# Patient Record
Sex: Male | Born: 1980 | Race: Black or African American | Hispanic: No | Marital: Single | Smoking: Current some day smoker
Health system: Southern US, Community
[De-identification: ages and names within clinical notes are randomized; demographics above are authoritative.]

## PROBLEM LIST (undated history)

## (undated) DIAGNOSIS — L409 Psoriasis, unspecified: Secondary | ICD-10-CM

## (undated) DIAGNOSIS — K259 Gastric ulcer, unspecified as acute or chronic, without hemorrhage or perforation: Secondary | ICD-10-CM

---
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
## 2003-12-10 ENCOUNTER — Emergency Department (HOSPITAL_COMMUNITY): Admission: EM | Admit: 2003-12-10 | Discharge: 2003-12-10 | Payer: Self-pay | Admitting: Emergency Medicine

## 2003-12-26 ENCOUNTER — Emergency Department (HOSPITAL_COMMUNITY): Admission: EM | Admit: 2003-12-26 | Discharge: 2003-12-26 | Payer: Self-pay | Admitting: *Deleted

## 2004-05-01 ENCOUNTER — Emergency Department (HOSPITAL_COMMUNITY): Admission: EM | Admit: 2004-05-01 | Discharge: 2004-05-01 | Payer: Self-pay | Admitting: Emergency Medicine

## 2004-07-12 ENCOUNTER — Emergency Department (HOSPITAL_COMMUNITY): Admission: EM | Admit: 2004-07-12 | Discharge: 2004-07-12 | Payer: Self-pay | Admitting: Emergency Medicine

## 2004-09-12 ENCOUNTER — Emergency Department (HOSPITAL_COMMUNITY): Admission: EM | Admit: 2004-09-12 | Discharge: 2004-09-12 | Payer: Self-pay | Admitting: Emergency Medicine

## 2005-03-18 ENCOUNTER — Emergency Department (HOSPITAL_COMMUNITY): Admission: EM | Admit: 2005-03-18 | Discharge: 2005-03-18 | Payer: Self-pay | Admitting: Family Medicine

## 2005-06-06 ENCOUNTER — Emergency Department (HOSPITAL_COMMUNITY): Admission: EM | Admit: 2005-06-06 | Discharge: 2005-06-06 | Payer: Self-pay | Admitting: Family Medicine

## 2005-09-29 IMAGING — CR DG CHEST 2V
2 series · 2 of 2 positions shown · non-contrast
Comparison: none

CLINICAL DATA: 23-year-old male, chest pain.
 CHEST, TWO VIEWS 12/26/03

[view not recorded (1 of 2)]
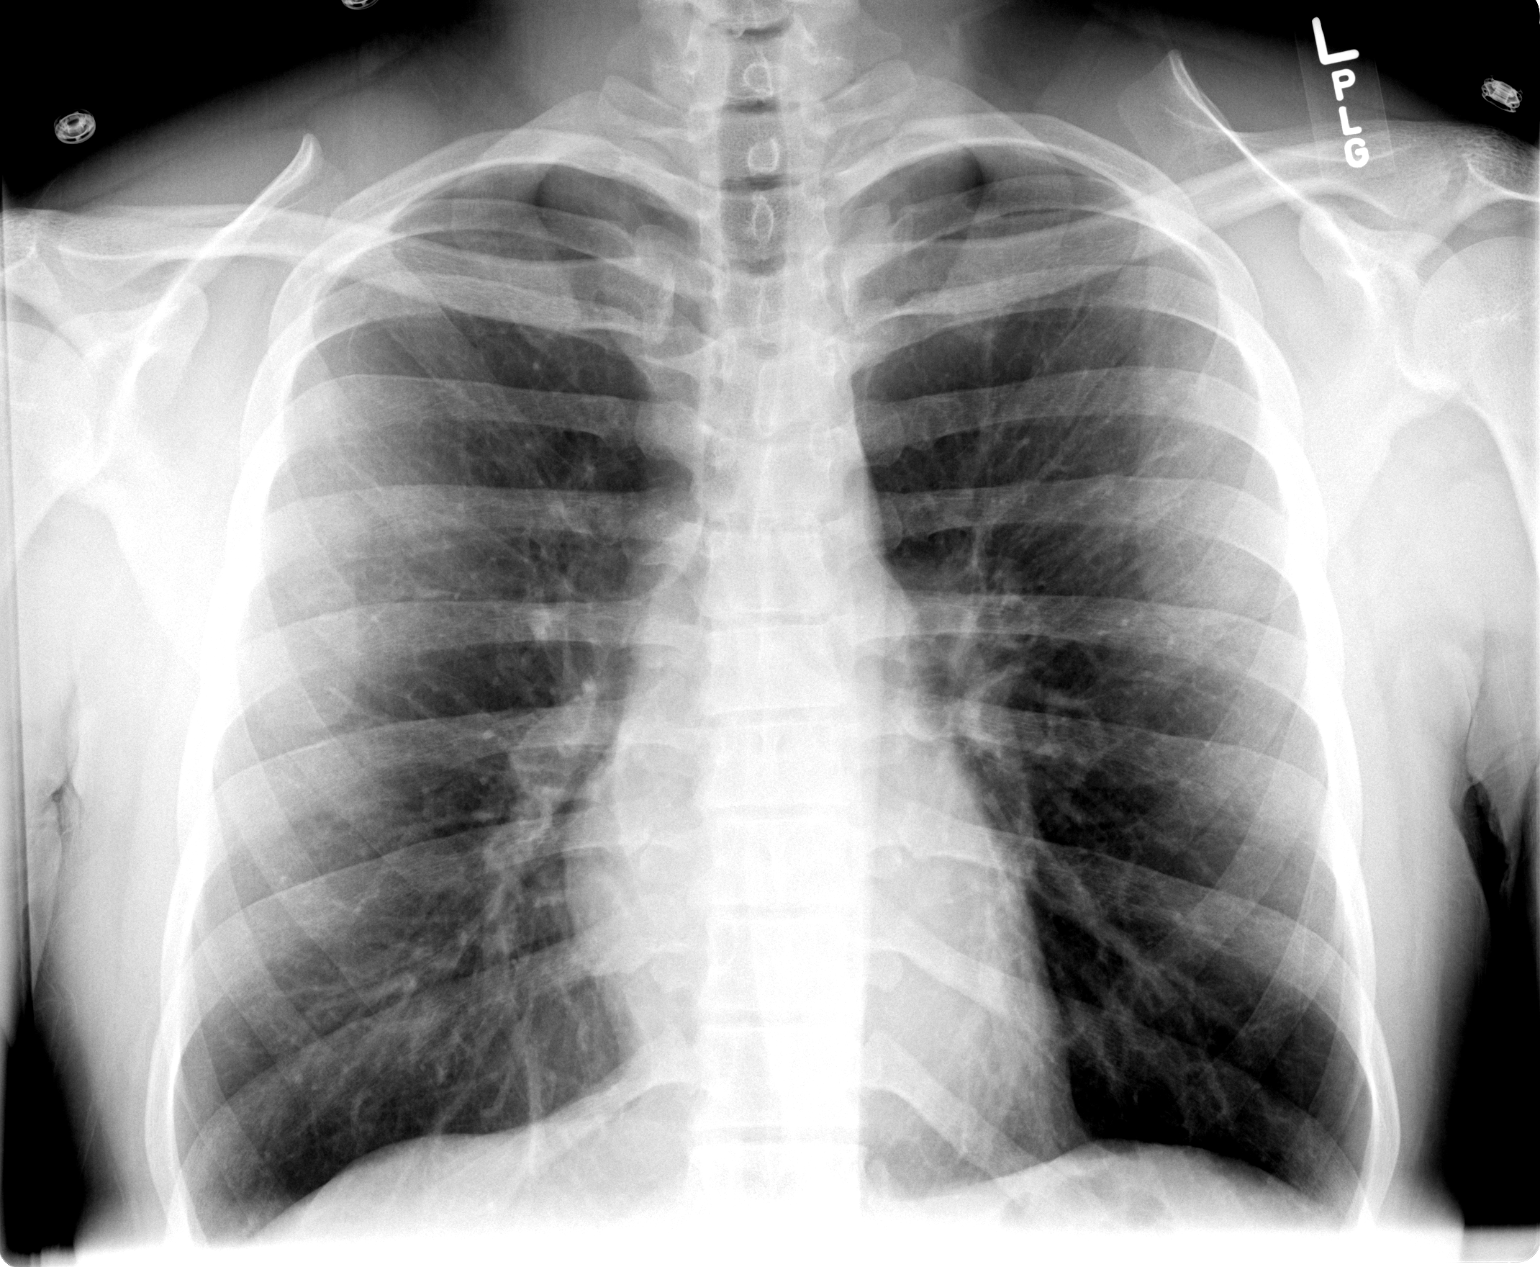

[view not recorded (2 of 2)]
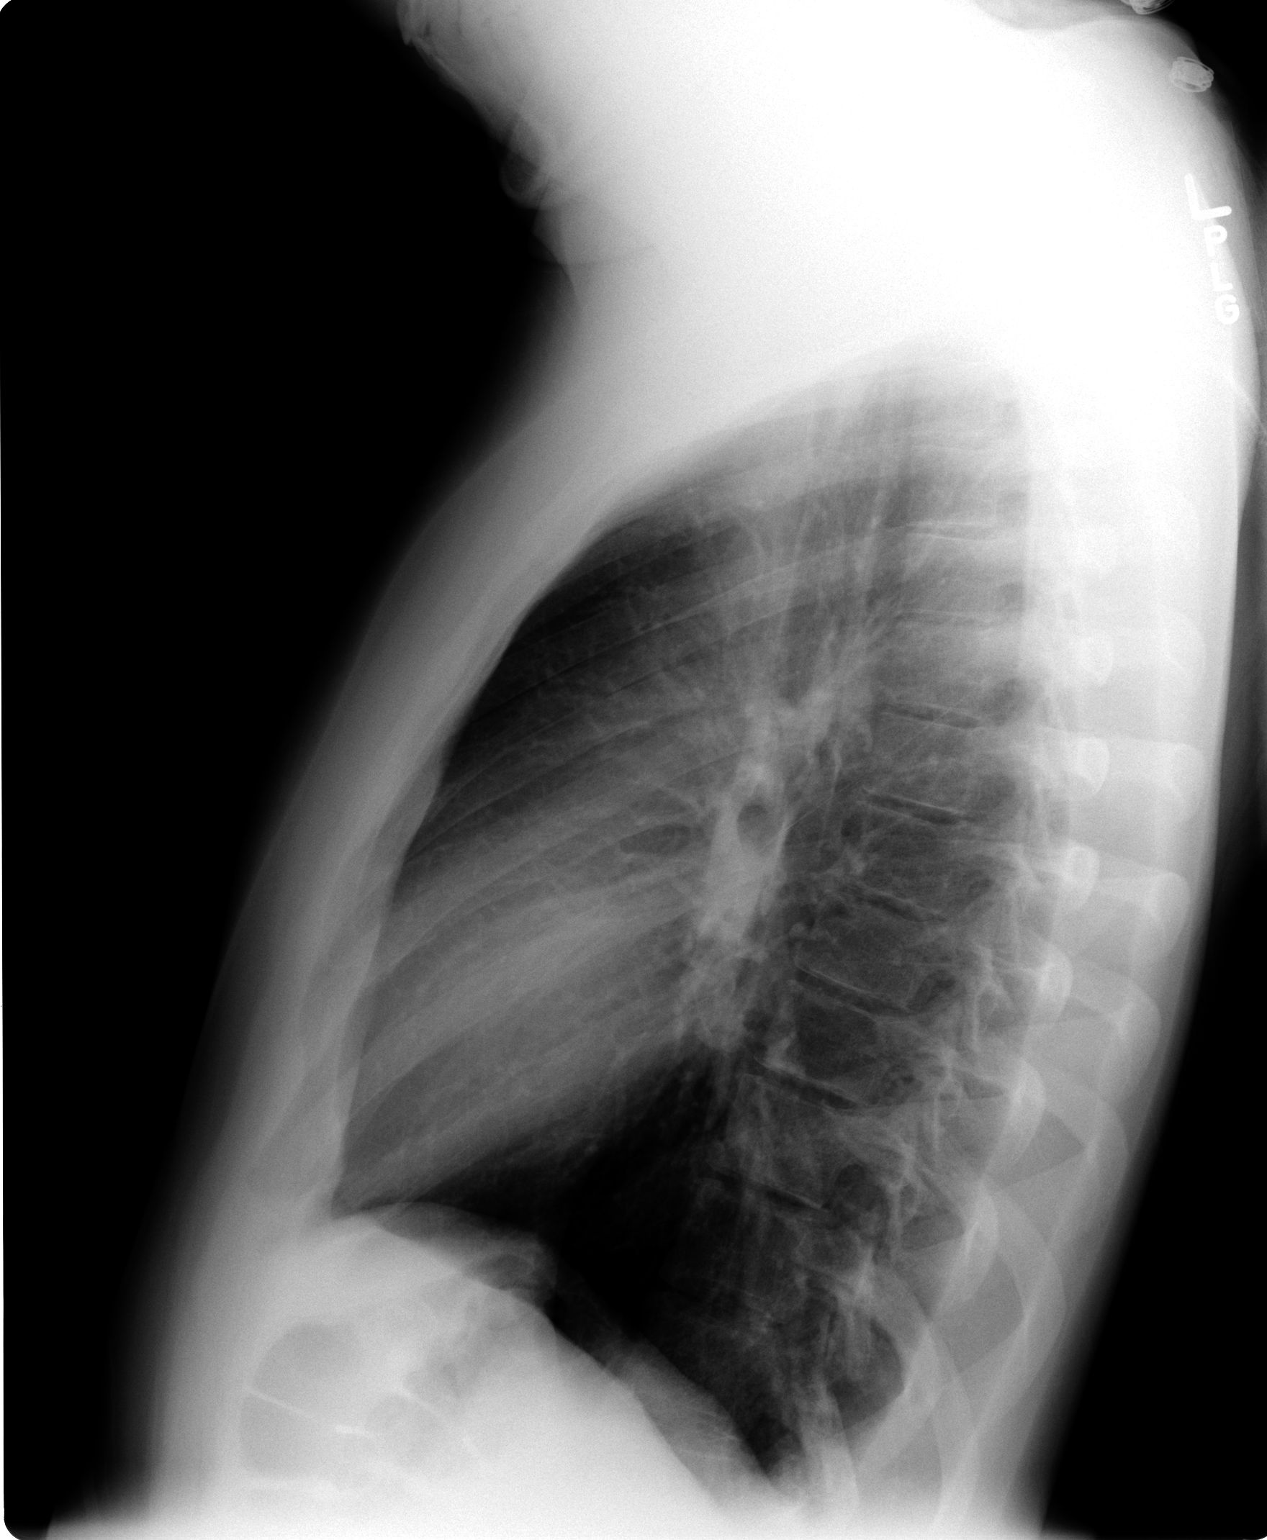

[2 of 2 positions shown; findings below may reference images not displayed]

FINDINGS: Inferolateral costophrenic angles are excluded on this study.  The lungs are clear.  No active airspace disease, edema, effusion, or pneumothorax.  Heart size is normal.  
 IMPRESSION
 No active chest disease.

## 2008-11-25 ENCOUNTER — Emergency Department: Payer: Self-pay | Admitting: Emergency Medicine

## 2010-09-11 ENCOUNTER — Emergency Department: Payer: Self-pay | Admitting: Emergency Medicine

## 2010-10-09 ENCOUNTER — Emergency Department: Payer: Self-pay | Admitting: Emergency Medicine

## 2011-02-13 ENCOUNTER — Emergency Department: Payer: Self-pay | Admitting: Emergency Medicine

## 2011-02-23 ENCOUNTER — Emergency Department: Payer: Self-pay | Admitting: Emergency Medicine

## 2011-06-27 ENCOUNTER — Emergency Department: Payer: Self-pay | Admitting: Emergency Medicine

## 2014-06-06 ENCOUNTER — Emergency Department: Payer: Self-pay | Admitting: Emergency Medicine

## 2014-06-06 LAB — BASIC METABOLIC PANEL
Anion Gap: 5 — ABNORMAL LOW (ref 7–16)
BUN: 10 mg/dL (ref 7–18)
CO2: 30 mmol/L (ref 21–32)
CREATININE: 1.15 mg/dL (ref 0.60–1.30)
Calcium, Total: 8.6 mg/dL (ref 8.5–10.1)
Chloride: 106 mmol/L (ref 98–107)
EGFR (African American): >60
Glucose: 88 mg/dL (ref 65–99)
Osmolality: 280 (ref 275–301)
Potassium: 3.8 mmol/L (ref 3.5–5.1)
SODIUM: 141 mmol/L (ref 136–145)

## 2014-06-06 LAB — CBC
HCT: 50.8 % (ref 40.0–52.0)
HGB: 16.7 g/dL (ref 13.0–18.0)
MCH: 30.5 pg (ref 26.0–34.0)
MCHC: 33 g/dL (ref 32.0–36.0)
MCV: 92 fL (ref 80–100)
Platelet: 217 10*3/uL (ref 150–440)
RBC: 5.5 10*6/uL (ref 4.40–5.90)
RDW: 13.5 % (ref 11.5–14.5)
WBC: 7.4 10*3/uL (ref 3.8–10.6)

## 2014-06-06 LAB — TROPONIN I

## 2014-08-06 ENCOUNTER — Emergency Department: Payer: Self-pay | Admitting: Student

## 2015-04-10 ENCOUNTER — Emergency Department
Admission: EM | Admit: 2015-04-10 | Discharge: 2015-04-10 | Disposition: A | Discharge status: Home / Self Care | Payer: Self-pay | Attending: Emergency Medicine | Admitting: Emergency Medicine

## 2015-04-10 ENCOUNTER — Encounter: Payer: Self-pay | Admitting: Emergency Medicine

## 2015-04-10 DIAGNOSIS — Z720 Tobacco use: Secondary | ICD-10-CM | POA: Insufficient documentation

## 2015-04-10 DIAGNOSIS — J069 Acute upper respiratory infection, unspecified: Secondary | ICD-10-CM | POA: Insufficient documentation

## 2015-04-10 DIAGNOSIS — F17200 Nicotine dependence, unspecified, uncomplicated: Secondary | ICD-10-CM

## 2015-04-10 NOTE — ED Notes (Signed)
Sent from work with cold like symptoms , cough productive ( greenish) , congestion , sore throat, felt warm

## 2015-04-10 NOTE — Discharge Instructions (Signed)
Upper Respiratory Infection, Adult An upper respiratory infection (URI) is also sometimes known as the common cold. The upper respiratory tract includes the nose, sinuses, throat, trachea, and bronchi. Bronchi are the airways leading to the lungs. Most people improve within 1 week, but symptoms can last up to 2 weeks. A residual cough may last even longer.  CAUSES Many different viruses can infect the tissues lining the upper respiratory tract. The tissues become irritated and inflamed and often become very moist. Mucus production is also common. A cold is contagious. You can easily spread the virus to others by oral contact. This includes kissing, sharing a glass, coughing, or sneezing. Touching your mouth or nose and then touching a surface, which is then touched by another person, can also spread the virus. SYMPTOMS  Symptoms typically develop 1 to 3 days after you come in contact with a cold virus. Symptoms vary from person to person. They may include:  Runny nose.  Sneezing.  Nasal congestion.  Sinus irritation.  Sore throat.  Loss of voice (laryngitis).  Cough.  Fatigue.  Muscle aches.  Loss of appetite.  Headache.  Low-grade fever. DIAGNOSIS  You might diagnose your own cold based on familiar symptoms, since most people get a cold 2 to 3 times a year. Your caregiver can confirm this based on your exam. Most importantly, your caregiver can check that your symptoms are not due to another disease such as strep throat, sinusitis, pneumonia, asthma, or epiglottitis. Blood tests, throat tests, and X-rays are not necessary to diagnose a common cold, but they may sometimes be helpful in excluding other more serious diseases. Your caregiver will decide if any further tests are required. RISKS AND COMPLICATIONS  You may be at risk for a more severe case of the common cold if you smoke cigarettes, have chronic heart disease (such as heart failure) or lung disease (such as asthma), or if  you have a weakened immune system. The very young and very old are also at risk for more serious infections. Bacterial sinusitis, middle ear infections, and bacterial pneumonia can complicate the common cold. The common cold can worsen asthma and chronic obstructive pulmonary disease (COPD). Sometimes, these complications can require emergency medical care and may be life-threatening. PREVENTION  The best way to protect against getting a cold is to practice good hygiene. Avoid oral or hand contact with people with cold symptoms. Wash your hands often if contact occurs. There is no clear evidence that vitamin C, vitamin E, echinacea, or exercise reduces the chance of developing a cold. However, it is always recommended to get plenty of rest and practice good nutrition. TREATMENT  Treatment is directed at relieving symptoms. There is no cure. Antibiotics are not effective, because the infection is caused by a virus, not by bacteria. Treatment may include:  Increased fluid intake. Sports drinks offer valuable electrolytes, sugars, and fluids.  Breathing heated mist or steam (vaporizer or shower).  Eating chicken soup or other clear broths, and maintaining good nutrition.  Getting plenty of rest.  Using gargles or lozenges for comfort.  Controlling fevers with ibuprofen or acetaminophen as directed by your caregiver.  Increasing usage of your inhaler if you have asthma. Zinc gel and zinc lozenges, taken in the first 24 hours of the common cold, can shorten the duration and lessen the severity of symptoms. Pain medicines may help with fever, muscle aches, and throat pain. A variety of non-prescription medicines are available to treat congestion and runny nose. Your caregiver   can make recommendations and may suggest nasal or lung inhalers for other symptoms.  HOME CARE INSTRUCTIONS   Only take over-the-counter or prescription medicines for pain, discomfort, or fever as directed by your  caregiver.  Use a warm mist humidifier or inhale steam from a shower to increase air moisture. This may keep secretions moist and make it easier to breathe.  Drink enough water and fluids to keep your urine clear or pale yellow.  Rest as needed.  Return to work when your temperature has returned to normal or as your caregiver advises. You may need to stay home longer to avoid infecting others. You can also use a face mask and careful hand washing to prevent spread of the virus. SEEK MEDICAL CARE IF:   After the first few days, you feel you are getting worse rather than better.  You need your caregiver's advice about medicines to control symptoms.  You develop chills, worsening shortness of breath, or brown or red sputum. These may be signs of pneumonia.  You develop yellow or brown nasal discharge or pain in the face, especially when you bend forward. These may be signs of sinusitis.  You develop a fever, swollen neck glands, pain with swallowing, or white areas in the back of your throat. These may be signs of strep throat. SEEK IMMEDIATE MEDICAL CARE IF:   You have a fever.  You develop severe or persistent headache, ear pain, sinus pain, or chest pain.  You develop wheezing, a prolonged cough, cough up blood, or have a change in your usual mucus (if you have chronic lung disease).  You develop sore muscles or a stiff neck. Document Released: 01/17/2001 Document Revised: 10/16/2011 Document Reviewed: 10/29/2013 ExitCare Patient Information 2015 [employer]. This information is not intended to replace advice given to you by your health care provider. Make sure you discuss any questions you have with your health care provider.  

## 2015-04-10 NOTE — ED Provider Notes (Signed)
[facility] Emergency Department Provider Note ____________________________________________  Time seen: Approximately 5:13 PM  I have reviewed the triage vital signs and the nursing notes.   HISTORY  Chief Complaint URI   HPI Stanley Cobb is a 34 y.o. male is here with complaint of productive cough, congestion, sore throat for a couple of days. He states his work made him come.  He has been taking ibuprofen for his symptoms without any relief. He denies any fever. He states he is a smoker approximately half pack cigarettes per day. He denies any previous bronchitis or pneumonia. He did have asthma as a child. Currently complains of his pain being as 7 out of 10 when he is coughing.   History reviewed. No pertinent past medical history.  There are no active problems to display for this patient.   History reviewed. No pertinent past surgical history.  No current outpatient prescriptions on file.  Allergies Review of patient's allergies indicates no known allergies.  No family history on file.  Social History Social History  Substance Use Topics  . Smoking status: Current Some Day Smoker  . Smokeless tobacco: None  . Alcohol Use: None    Review of Systems Constitutional: No fever/chills Eyes: No visual changes. ENT: Positive sore throat. Cardiovascular: Denies chest pain. Respiratory: Denies shortness of breath. Productive cough Gastrointestinal: No abdominal pain.  No nausea, no vomiting.  No diarrhea.  No constipation. Genitourinary: Negative for dysuria. Musculoskeletal: Negative for back pain. Skin: Negative for rash. Neurological: Negative for headaches, focal weakness or numbness.  10-point ROS otherwise negative.  ____________________________________________   PHYSICAL EXAM:  VITAL SIGNS: ED Triage Vitals  Enc Vitals Group     BP 04/10/15 1700 130/94 mmHg     Pulse Rate 04/10/15 1700 90     Resp 04/10/15 1700 20     Temp  04/10/15 1700 97.8 F (36.6 C)     Temp Source 04/10/15 1700 Oral     SpO2 04/10/15 1700 97 %     Weight 04/10/15 1700 185 lb (83.915 kg)     Height 04/10/15 1700 6\' 2"  (1.88 m)     Head Cir --      Peak Flow --      Pain Score 04/10/15 1700 7     Pain Loc --      Pain Edu? --      Excl. in GC? --     Constitutional: Alert and oriented. Well appearing and in no acute distress. Eyes: Conjunctivae are normal. PERRL. EOMI. Head: Atraumatic. Nose: Moderate nasal congestion/no rhinnorhea. Mouth/Throat: Mucous membranes are moist.  Oropharynx non-erythematous. Neck: No stridor.  Supple Hematological/Lymphatic/Immunilogical: No cervical lymphadenopathy. Cardiovascular: Normal rate, regular rhythm. Grossly normal heart sounds.  Good peripheral circulation. Respiratory: Normal respiratory effort.  No retractions. Lungs CTAB. Gastrointestinal: Soft and nontender. No distention. Musculoskeletal: No lower extremity tenderness nor edema.  No joint effusions. Neurologic:  Normal speech and language. No gross focal neurologic deficits are appreciated. No gait instability. Skin:  Skin is warm, dry and intact. No rash noted. Psychiatric: Mood and affect are normal. Speech and behavior are normal.  ____________________________________________   LABS (all labs ordered are listed, but only abnormal results are displayed)  Labs Reviewed - No data to display  PROCEDURES  Procedure(s) performed: None  Critical Care performed: No  ____________________________________________   INITIAL IMPRESSION / ASSESSMENT AND PLAN / ED COURSE  Pertinent labs & imaging results that were available during my care of the patient  were reviewed by me and considered in my medical decision making (see chart for details).  Patient has not taken any  Medication for congestion. He was offered a prescription but states that he will get something over-the-counter. He basically wants a note saying that he was  here. ____________________________________________   FINAL CLINICAL IMPRESSION(S) / ED DIAGNOSES  Final diagnoses:  Acute upper respiratory infection  Smoker      Tommi Rumps, PA-C 04/10/15 1934  Arnaldo Natal, MD 04/10/15 2127

## 2015-07-20 ENCOUNTER — Encounter: Payer: Self-pay | Admitting: Emergency Medicine

## 2015-07-20 ENCOUNTER — Emergency Department: Payer: Self-pay

## 2015-07-20 ENCOUNTER — Emergency Department
Admission: EM | Admit: 2015-07-20 | Discharge: 2015-07-20 | Disposition: A | Discharge status: Home / Self Care | Payer: Self-pay | Attending: Emergency Medicine | Admitting: Emergency Medicine

## 2015-07-20 DIAGNOSIS — F17210 Nicotine dependence, cigarettes, uncomplicated: Secondary | ICD-10-CM | POA: Insufficient documentation

## 2015-07-20 DIAGNOSIS — R059 Cough, unspecified: Secondary | ICD-10-CM

## 2015-07-20 DIAGNOSIS — B9789 Other viral agents as the cause of diseases classified elsewhere: Secondary | ICD-10-CM

## 2015-07-20 DIAGNOSIS — R05 Cough: Secondary | ICD-10-CM

## 2015-07-20 DIAGNOSIS — J069 Acute upper respiratory infection, unspecified: Secondary | ICD-10-CM | POA: Insufficient documentation

## 2015-07-20 MED ORDER — DM-GUAIFENESIN ER 30-600 MG PO TB12: 1.0000 | ORAL_TABLET | Freq: Two times a day (BID) | ORAL | Status: DC

## 2015-07-20 MED ORDER — BENZONATATE 100 MG PO CAPS: 100.0000 mg | ORAL_CAPSULE | Freq: Three times a day (TID) | ORAL | Status: DC | PRN

## 2015-07-20 NOTE — ED Provider Notes (Signed)
CSN: [id number]     Arrival date & time 07/20/15  1011 History   First MD Initiated Contact with Patient 07/20/15 1032     Chief Complaint  Patient presents with  . Influenza    HPI Comments: 34 year old male presents today complaining of cough, congestion and problems breathing for the last week. He admits to smoking a pack per day of cigarettes. He has not measured his temperature but thinks he's had a fever. His daughter has been sick with similar symptoms. Does not have a history of asthma, pneumonia or lung disease. Has been taking TheraFlu over-the-counter without relief.  The history is provided by the patient.    History reviewed. No pertinent past medical history. History reviewed. No pertinent past surgical history. No family history on file. Social History  Substance Use Topics  . Smoking status: Current Some Day Smoker  . Smokeless tobacco: None  . Alcohol Use: No    Review of Systems  Constitutional: Positive for fever. Negative for chills.  HENT: Positive for congestion.   Respiratory: Positive for cough. Negative for shortness of breath.   Musculoskeletal: Negative for myalgias and arthralgias.  Skin: Negative for rash.  All other systems reviewed and are negative.     Allergies  Review of patient's allergies indicates no known allergies.  Home Medications   Prior to Admission medications   Medication Sig Start Date End Date Taking? Authorizing Provider  benzonatate (TESSALON PERLES) 100 MG capsule Take 1 capsule (100 mg total) by mouth 3 (three) times daily as needed for cough. 07/20/15 07/19/16  Luvenia Redden, PA-C  dextromethorphan-guaiFENesin (MUCINEX DM) 30-600 MG 12hr tablet Take 1 tablet by mouth 2 (two) times daily. 07/20/15   Wilber Oliphant V, PA-C   BP 120/80 mmHg  Pulse 76  Temp(Src) 97.7 F (36.5 C) (Oral)  Resp 16  Ht  (1.88 m)  Wt 83.915 kg  BMI 23.74 kg/m2  SpO2 99% Physical Exam  Constitutional: He is oriented to person, place,  and time. Vital signs are normal. He appears well-developed and well-nourished. He is active.  Non-toxic appearance. He does not have a sickly appearance. He does not appear ill.  HENT:  Head: Normocephalic and atraumatic.  Right Ear: Tympanic membrane and external ear normal.  Left Ear: Tympanic membrane and external ear normal.  Nose: Mucosal edema and rhinorrhea present.  Mouth/Throat: Uvula is midline and oropharynx is clear and moist.  Eyes: Conjunctivae and EOM are normal. Pupils are equal, round, and reactive to light.  Neck: Normal range of motion. Neck supple.  Cardiovascular: Normal rate, regular rhythm, normal heart sounds and intact distal pulses.  Exam reveals no gallop and no friction rub.   No murmur heard. Pulmonary/Chest: Effort normal. He has wheezes. He has no rales.  Musculoskeletal: Normal range of motion.  Lymphadenopathy:    He has no cervical adenopathy.  Neurological: He is alert and oriented to person, place, and time.  Skin: Skin is warm and dry. No rash noted.  Psychiatric: He has a normal mood and affect. His behavior is normal. Judgment and thought content normal.  Nursing note and vitals reviewed.   ED Course  Procedures (including critical care time) Labs Review Labs Reviewed - No data to display  Imaging Review Dg Chest 2 View  07/20/2015  CLINICAL DATA:  Worsening cough, congestion, chest pain and sore throat for 1 week. EXAM: CHEST  2 VIEW COMPARISON:  06/06/2014 FINDINGS: The heart size and mediastinal contours are within normal limits.  Both lungs are clear. The visualized skeletal structures are unremarkable. IMPRESSION: Normal chest x-ray. Electronically Signed   By: Rudie MeyerP.  Gallerani M.D.   On: 07/20/2015 11:31   I have personally reviewed and evaluated these images and lab results as part of my medical decision-making.   EKG Interpretation None      MDM  I independently reviewed the chest x-ray and there is no evidence of cardiopulmonary  disease. Strongly encouraged patient to stop smoking. Prescription for [employer] Perles and Mucinex. Encouraged him to drink plenty of fluids take antipyretics as needed follow-up here if no improvement since [facility] Final diagnoses:  Cough  Viral URI with cough        Luvenia ReddenEmma Weavil V, PA-C 07/20/15 [address]1150  Braelyn Jenson Weavil V, PA-C 07/20/15 1150  Sharman CheekPhillip Stafford, MD 07/20/15 1514

## 2015-07-20 NOTE — ED Notes (Signed)
AAOx3.  Skin warm and dry.  NAD 

## 2015-07-20 NOTE — ED Notes (Signed)
States body aches with cough and congestion  For about 1 week

## 2015-07-20 NOTE — ED Notes (Signed)
See triage note  States he has felt bad for about 1 week..body aches for occasional fever and feels like he can't breath lungs clear   And no resp distress noted at present

## 2015-09-11 ENCOUNTER — Encounter: Payer: Self-pay | Admitting: Emergency Medicine

## 2015-09-11 ENCOUNTER — Emergency Department
Admission: EM | Admit: 2015-09-11 | Discharge: 2015-09-11 | Disposition: A | Discharge status: Home / Self Care | Payer: Self-pay | Attending: Emergency Medicine | Admitting: Emergency Medicine

## 2015-09-11 DIAGNOSIS — J029 Acute pharyngitis, unspecified: Secondary | ICD-10-CM | POA: Insufficient documentation

## 2015-09-11 DIAGNOSIS — Z79899 Other long term (current) drug therapy: Secondary | ICD-10-CM | POA: Insufficient documentation

## 2015-09-11 DIAGNOSIS — F17200 Nicotine dependence, unspecified, uncomplicated: Secondary | ICD-10-CM | POA: Insufficient documentation

## 2015-09-11 LAB — POCT RAPID STREP A: STREPTOCOCCUS, GROUP A SCREEN (DIRECT): NEGATIVE

## 2015-09-11 LAB — MONONUCLEOSIS SCREEN: MONO SCREEN: NEGATIVE

## 2015-09-11 MED ORDER — AMOXICILLIN 500 MG PO TABS: 500.0000 mg | ORAL_TABLET | Freq: Three times a day (TID) | ORAL | Status: DC

## 2015-09-11 MED ORDER — PREDNISONE 20 MG PO TABS
60.0000 mg | ORAL_TABLET | Freq: Once | ORAL | Status: AC
  Administered 2015-09-11: 60 mg via ORAL
  Filled 2015-09-11: qty 3

## 2015-09-11 MED ORDER — PREDNISONE 10 MG (21) PO TBPK
ORAL_TABLET | ORAL | Status: DC
Start: 2015-09-11 — End: 2016-04-13

## 2015-09-11 MED ORDER — IBUPROFEN 800 MG PO TABS
800.0000 mg | ORAL_TABLET | Freq: Once | ORAL | Status: AC
  Administered 2015-09-11: 800 mg via ORAL
  Filled 2015-09-11: qty 1

## 2015-09-11 MED ORDER — AMOXICILLIN 500 MG PO CAPS
500.0000 mg | ORAL_CAPSULE | Freq: Once | ORAL | Status: AC
  Administered 2015-09-11: 500 mg via ORAL
  Filled 2015-09-11: qty 1

## 2015-09-11 NOTE — ED Provider Notes (Signed)
[facility] Emergency Department Provider Note  ____________________________________________  Time seen: Approximately 5:20 PM  I have reviewed the triage vital signs and the nursing notes.   HISTORY  Chief Complaint Sore Throat    HPI Stanley Cobb is a 35 y.o. male who presents with sore throat 2 days. Difficulty swallowing due to pain. Minimal congestion. Feels feverish. Minimal cough. No known exposure. No ear pain chest pain shortness of breath.   History reviewed. No pertinent past medical history.  There are no active problems to display for this patient.   History reviewed. No pertinent past surgical history.  Current Outpatient Rx  Name  Route  Sig  Dispense  Refill  . benzonatate (TESSALON PERLES) 100 MG capsule   Oral   Take 1 capsule (100 mg total) by mouth 3 (three) times daily as needed for cough.   30 capsule   0   . dextromethorphan-guaiFENesin (MUCINEX DM) 30-600 MG 12hr tablet   Oral   Take 1 tablet by mouth 2 (two) times daily.   30 tablet   o     Allergies Review of patient's allergies indicates no known allergies.  No family history on file.  Social History Social History  Substance Use Topics  . Smoking status: Current Some Day Smoker  . Smokeless tobacco: None  . Alcohol Use: No    Review of Systems Constitutional: fever Eyes: No visual changes. ENT: per HPI Cardiovascular: Denies chest pain. Respiratory: Denies shortness of breath.  Gastrointestinal: No abdominal pain.  No nausea, no vomiting.  No diarrhea.  No constipation. Musculoskeletal: Negative for back pain. Skin: Negative for rash. Neurological: Negative for headaches, focal weakness or numbness. 10-point ROS otherwise negative.  ____________________________________________   PHYSICAL EXAM:  VITAL SIGNS: ED Triage Vitals  Enc Vitals Group     BP 09/11/15 1636 127/84 mmHg     Pulse Rate 09/11/15 1636 85     Resp 09/11/15 1636 20   Temp 09/11/15 1636 97.8 F (36.6 C)     Temp Source 09/11/15 1636 Oral     SpO2 09/11/15 1636 98 %     Weight 09/11/15 1636 187 lb (84.823 kg)     Height 09/11/15 1636  (1.88 m)     Head Cir --      Peak Flow --      Pain Score 09/11/15 1636 10     Pain Loc --      Pain Edu? --      Excl. in GC? --     Constitutional: Alert and oriented. Well appearing and in no acute distress. Eyes: Conjunctivae are normal. PERRL. EOMI. Ears:  Clear with normal landmarks. No erythema. Head: Atraumatic. Nose: No congestion/rhinnorhea. Mouth/Throat: Mucous membranes are moist.  Oropharynx erythematous with mild swelling but no clear abscess noted. No lesions. Neck:  Supple.  No adenopathy.   Cardiovascular: Normal rate, regular rhythm. Grossly normal heart sounds.  Good peripheral circulation. Respiratory: Normal respiratory effort.  No retractions. Lungs CTAB. Gastrointestinal: Soft and nontender. No distention. No abdominal bruits. No CVA tenderness. Musculoskeletal: Nml ROM of upper and lower extremity joints. Neurologic:  Normal speech and language. No gross focal neurologic deficits are appreciated. No gait instability. Skin:  Skin is warm, dry and intact. No rash noted. Psychiatric: Mood and affect are normal. Speech and behavior are normal.  ____________________________________________   LABS (all labs ordered are listed, but only abnormal results are displayed)  Labs Reviewed  MONONUCLEOSIS SCREEN  POCT RAPID  STREP A   ____________________________________________  EKG   ____________________________________________  RADIOLOGY   ____________________________________________   PROCEDURES  Procedure(s) performed: None  Critical Care performed: No  ____________________________________________   INITIAL IMPRESSION / ASSESSMENT AND PLAN / ED COURSE  Pertinent labs & imaging results that were available during my care of the patient were reviewed by me and considered in  my medical decision making (see chart for details).  35 year old male with acute onset of severe tonsillitis. Negative strep and mono spot. Because of the severity, there will be a high suspicion for bacterial etiology. Started on amoxicillin. He may take prednisone taper for pain control. Discussed with the patient the possibility of abscess forming. He will return to the emergency room for any worsening symptoms. ____________________________________________   FINAL CLINICAL IMPRESSION(S) / ED DIAGNOSES  Final diagnoses:  Pharyngitis      Ignacia Bayley, PA-C 09/11/15 1814  Ignacia Bayley, PA-C 09/11/15 Rickey Primus  Sharyn Creamer, MD 09/11/15 2250

## 2015-09-11 NOTE — ED Notes (Signed)
Patient presents to the ED for sore throat that began yesterday.  Patient is in no obvious distress at this time.  Patient states, "I feel like I might have a fever."  Patient denies taking temp. At home.  Patient denies nausea, vomiting, diarrhea, and abdominal pain.  Patient is in no obvious distress at this time.

## 2015-09-11 NOTE — Discharge Instructions (Signed)
Pharyngitis Pharyngitis is redness, pain, and swelling (inflammation) of your pharynx.  CAUSES  Pharyngitis is usually caused by infection. Most of the time, these infections are from viruses (viral) and are part of a cold. However, sometimes pharyngitis is caused by bacteria (bacterial). Pharyngitis can also be caused by allergies. Viral pharyngitis may be spread from person to person by coughing, sneezing, and personal items or utensils (cups, forks, spoons, toothbrushes). Bacterial pharyngitis may be spread from person to person by more intimate contact, such as kissing.  SIGNS AND SYMPTOMS  Symptoms of pharyngitis include:   Sore throat.   Tiredness (fatigue).   Low-grade fever.   Headache.  Joint pain and muscle aches.  Skin rashes.  Swollen lymph nodes.  Plaque-like film on throat or tonsils (often seen with bacterial pharyngitis). DIAGNOSIS  Your health care provider will ask you questions about your illness and your symptoms. Your medical history, along with a physical exam, is often all that is needed to diagnose pharyngitis. Sometimes, a rapid strep test is done. Other lab tests may also be done, depending on the suspected cause.  TREATMENT  Viral pharyngitis will usually get better in 3-4 days without the use of medicine. Bacterial pharyngitis is treated with medicines that kill germs (antibiotics).  HOME CARE INSTRUCTIONS   Drink enough water and fluids to keep your urine clear or pale yellow.   Only take over-the-counter or prescription medicines as directed by your health care provider:   If you are prescribed antibiotics, make sure you finish them even if you start to feel better.   Do not take aspirin.   Get lots of rest.   Gargle with 8 oz of salt water ( tsp of salt per 1 qt of water) as often as every 1-2 hours to soothe your throat.   Throat lozenges (if you are not at risk for choking) or sprays may be used to soothe your throat. SEEK MEDICAL  CARE IF:   You have large, tender lumps in your neck.  You have a rash.  You cough up green, yellow-brown, or bloody spit. SEEK IMMEDIATE MEDICAL CARE IF:   Your neck becomes stiff.  You drool or are unable to swallow liquids.  You vomit or are unable to keep medicines or liquids down.  You have severe pain that does not go away with the use of recommended medicines.  You have trouble breathing (not caused by a stuffy nose). MAKE SURE YOU:   Understand these instructions.  Will watch your condition.  Will get help right away if you are not doing well or get worse.   This information is not intended to replace advice given to you by your health care provider. Make sure you discuss any questions you have with your health care provider.   Document Released: 07/24/2005 Document Revised: 05/14/2013 Document Reviewed: 03/31/2013 Elsevier Interactive Patient Education 2016 [employer].   Take antibiotics as directed. You may use prednisone taper for symptoms of pain. He can also use ibuprofen. If any signs of worsening, return to the emergency room for further evaluation.

## 2016-02-27 ENCOUNTER — Emergency Department
Admission: EM | Admit: 2016-02-27 | Discharge: 2016-02-27 | Disposition: A | Discharge status: Home / Self Care | Payer: Self-pay | Attending: Emergency Medicine | Admitting: Emergency Medicine

## 2016-02-27 ENCOUNTER — Emergency Department: Payer: Self-pay

## 2016-02-27 DIAGNOSIS — Y93F2 Activity, caregiving, lifting: Secondary | ICD-10-CM | POA: Insufficient documentation

## 2016-02-27 DIAGNOSIS — L409 Psoriasis, unspecified: Secondary | ICD-10-CM | POA: Insufficient documentation

## 2016-02-27 DIAGNOSIS — X500XXA Overexertion from strenuous movement or load, initial encounter: Secondary | ICD-10-CM | POA: Insufficient documentation

## 2016-02-27 DIAGNOSIS — L989 Disorder of the skin and subcutaneous tissue, unspecified: Secondary | ICD-10-CM

## 2016-02-27 DIAGNOSIS — Y999 Unspecified external cause status: Secondary | ICD-10-CM | POA: Insufficient documentation

## 2016-02-27 DIAGNOSIS — Y929 Unspecified place or not applicable: Secondary | ICD-10-CM | POA: Insufficient documentation

## 2016-02-27 DIAGNOSIS — F17200 Nicotine dependence, unspecified, uncomplicated: Secondary | ICD-10-CM | POA: Insufficient documentation

## 2016-02-27 DIAGNOSIS — S39012A Strain of muscle, fascia and tendon of lower back, initial encounter: Secondary | ICD-10-CM | POA: Insufficient documentation

## 2016-02-27 MED ORDER — IBUPROFEN 800 MG PO TABS: 800.0000 mg | ORAL_TABLET | Freq: Three times a day (TID) | ORAL | 0 refills | Status: DC | PRN

## 2016-02-27 MED ORDER — CYCLOBENZAPRINE HCL 10 MG PO TABS: 10.0000 mg | ORAL_TABLET | Freq: Three times a day (TID) | ORAL | 0 refills | Status: DC | PRN

## 2016-02-27 MED ORDER — CLOTRIMAZOLE-BETAMETHASONE 1-0.05 % EX CREA: TOPICAL_CREAM | CUTANEOUS | 1 refills | Status: DC

## 2016-02-27 MED ORDER — HYDROMORPHONE HCL 1 MG/ML IJ SOLN
1.0000 mg | Freq: Once | INTRAMUSCULAR | Status: AC
  Administered 2016-02-27: 1 mg via INTRAMUSCULAR
  Filled 2016-02-27: qty 1

## 2016-02-27 MED ORDER — DIAZEPAM 5 MG/ML IJ SOLN
5.0000 mg | Freq: Once | INTRAMUSCULAR | Status: AC
  Administered 2016-02-27: 5 mg via INTRAMUSCULAR
  Filled 2016-02-27: qty 2

## 2016-02-27 NOTE — ED Provider Notes (Signed)
[facility] Emergency Department Provider Note  ____________________________________________  Time seen: Approximately 2:05 PM  I have reviewed the triage vital signs and the nursing notes.   HISTORY  Chief Complaint Back Pain    HPI Stanley Cobb is a 35 y.o. male presents for evaluation of sudden onset of lower back pain. Patient states he initially hurt his back 2 years ago while in prison. States he was lifting 2 days ago and exacerbated that back pain. States he's got some burning and numbness going down his right leg but denies any groin paresthesia or numbness. Patient's pain is a 10 over 10 and no relief with over-the-counter medications.   No past medical history on file.  There are no active problems to display for this patient.   No past surgical history on file.  Current Outpatient Rx  . Order #: [id number] Class: Print  . Order #: [id number] Class: Print  . Order #: [id number] Class: Print  . Order #: [id number] Class: Print  . Order #: [id number] Class: Print  . Order #: [id number] Class: Print  . Order #: [id number] Class: Print    Allergies Review of patient's allergies indicates no known allergies.  No family history on file.  Social History Social History  Substance Use Topics  . Smoking status: Current Some Day Smoker  . Smokeless tobacco: Not on file  . Alcohol use No    Review of Systems Constitutional: No fever/chills Cardiovascular: Denies chest pain. Respiratory: Denies shortness of breath. Gastrointestinal: No abdominal pain.  No nausea, no vomiting.  No diarrhea.  No constipation. Genitourinary: Negative for dysuria. Musculoskeletal: Positive for low back pain. Skin: Negative for rash. Neurological: Negative for headaches, focal weakness or numbness.  10-point ROS otherwise negative.  ____________________________________________   PHYSICAL EXAM:  VITAL SIGNS: ED Triage Vitals  Enc Vitals Group     BP 02/27/16 1218 (!)  127/91     Pulse Rate 02/27/16 1218 94     Resp --      Temp 02/27/16 1218 97.9 F (36.6 C)     Temp Source 02/27/16 1218 Oral     SpO2 02/27/16 1218 95 %     Weight 02/27/16 1218 185 lb (83.9 kg)     Height 02/27/16 1218  (1.88 m)     Head Circumference --      Peak Flow --      Pain Score 02/27/16 1221 10     Pain Loc --      Pain Edu? --      Excl. in GC? --     Constitutional: Alert and oriented. Well appearing and in no acute distress. Neck: No stridor.  Supple full range of motion nontender. Cardiovascular: Normal rate, regular rhythm. Grossly normal heart sounds.  Good peripheral circulation. Respiratory: Normal respiratory effort.  No retractions. Lungs CTAB. Gastrointestinal: Soft and nontender. No distention. No CVA tenderness. Musculoskeletal:Straight leg raise positive on the right and 40 unremarkable on the left. Point tenderness noted to the lumbar spinal area. Neurologic:  Normal speech and language. No gross focal neurologic deficits are appreciated. No gait instability. Skin:  Skin is warm, dry and intact. No rash noted. Psychiatric: Mood and affect are normal. Speech and behavior are normal.  ____________________________________________   LABS (all labs ordered are listed, but only abnormal results are displayed)  Labs Reviewed - No data to display ____________________________________________  EKG   ____________________________________________  RADIOLOGY  No acute osseous findings. ____________________________________________   PROCEDURES  Procedure(s) performed: None  Critical Care  performed: No  ____________________________________________   INITIAL IMPRESSION / ASSESSMENT AND PLAN / ED COURSE  Pertinent labs & imaging results that were available during my care of the patient were reviewed by me and considered in my medical decision making (see chart for details).  Acute lumbosacral strain. Rx given for Dilaudid 1 mg IM and Valium  5 mg IM. Patient's symptoms improved while in the ED. Patient to be discharged home with Rx for Flexeril, ibuprofen and to see his PCP for any worsening symptomology. Work excuse 24 hours given.  Clinical Course    ____________________________________________   FINAL CLINICAL IMPRESSION(S) / ED DIAGNOSES  Final diagnoses:  Lumbar strain, initial encounter  Psoriasis/like disorders     This chart was dictated using voice recognition software/Dragon. Despite best efforts to proofread, errors can occur which can change the meaning. Any change was purely unintentional.    Evangeline Dakin, PA-C 02/27/16 1447    Governor Rooks, MD 02/27/16 [phone]

## 2016-02-27 NOTE — ED Notes (Signed)
Pt has hx of chronic back pain. Worsening pain.

## 2016-02-27 NOTE — ED Triage Notes (Signed)
Pt reports he was moving a cough 2 days ago now with pain in lower that radiates down right leg

## 2016-02-27 NOTE — ED Notes (Signed)
Patient transported to X-ray 

## 2016-04-01 ENCOUNTER — Encounter: Payer: Self-pay | Admitting: Emergency Medicine

## 2016-04-01 ENCOUNTER — Emergency Department
Admission: EM | Admit: 2016-04-01 | Discharge: 2016-04-01 | Disposition: A | Discharge status: Home / Self Care | Payer: Self-pay | Attending: Emergency Medicine | Admitting: Emergency Medicine

## 2016-04-01 DIAGNOSIS — F17200 Nicotine dependence, unspecified, uncomplicated: Secondary | ICD-10-CM | POA: Insufficient documentation

## 2016-04-01 DIAGNOSIS — Z792 Long term (current) use of antibiotics: Secondary | ICD-10-CM | POA: Insufficient documentation

## 2016-04-01 DIAGNOSIS — K047 Periapical abscess without sinus: Secondary | ICD-10-CM | POA: Insufficient documentation

## 2016-04-01 DIAGNOSIS — Z79899 Other long term (current) drug therapy: Secondary | ICD-10-CM | POA: Insufficient documentation

## 2016-04-01 MED ORDER — AMOXICILLIN 500 MG PO CAPS
500.0000 mg | ORAL_CAPSULE | Freq: Once | ORAL | Status: AC
  Administered 2016-04-01: 500 mg via ORAL
  Filled 2016-04-01: qty 1

## 2016-04-01 MED ORDER — TRAMADOL HCL 50 MG PO TABS: 50.0000 mg | ORAL_TABLET | Freq: Four times a day (QID) | ORAL | 0 refills | Status: DC | PRN

## 2016-04-01 MED ORDER — AMOXICILLIN 500 MG PO CAPS: 500.0000 mg | ORAL_CAPSULE | Freq: Three times a day (TID) | ORAL | 0 refills | Status: DC

## 2016-04-01 MED ORDER — LIDOCAINE VISCOUS 2 % MT SOLN
15.0000 mL | Freq: Once | OROMUCOSAL | Status: AC
  Administered 2016-04-01: 15 mL via OROMUCOSAL
  Filled 2016-04-01: qty 15

## 2016-04-01 MED ORDER — IBUPROFEN 600 MG PO TABS: 600.0000 mg | ORAL_TABLET | Freq: Three times a day (TID) | ORAL | 0 refills | Status: DC | PRN

## 2016-04-01 MED ORDER — IBUPROFEN 600 MG PO TABS
600.0000 mg | ORAL_TABLET | Freq: Once | ORAL | Status: AC
  Administered 2016-04-01: 600 mg via ORAL
  Filled 2016-04-01: qty 1

## 2016-04-01 MED ORDER — LIDOCAINE VISCOUS 2 % MT SOLN: 5.0000 mL | Freq: Four times a day (QID) | OROMUCOSAL | 0 refills | Status: DC | PRN

## 2016-04-01 NOTE — ED Triage Notes (Signed)
Dental pain x 3 days.  

## 2016-04-01 NOTE — ED Provider Notes (Signed)
[facility] Emergency Department Provider Note   ____________________________________________   None    (approximate)  I have reviewed the triage vital signs and the nursing notes.   HISTORY  Chief Complaint Dental Pain    HPI Stanley Cobb is a 35 y.o. male patient complain right upper molar dental pain and edema for 3 days.Patient denies any fever or chills associated with this complaint. Patient's redness pain is a 6/10. No palliative measures taken for this complaint.   History reviewed. No pertinent past medical history.  There are no active problems to display for this patient.   History reviewed. No pertinent surgical history.  Prior to Admission medications   Medication Sig Start Date End Date Taking? Authorizing Provider  amoxicillin (AMOXIL) 500 MG tablet Take 1 tablet (500 mg total) by mouth 3 (three) times daily. 09/11/15   Ignacia Bayley, PA-C  benzonatate (TESSALON PERLES) 100 MG capsule Take 1 capsule (100 mg total) by mouth 3 (three) times daily as needed for cough. 07/20/15 07/19/16  Christella Scheuermann, PA-C  clotrimazole-betamethasone (LOTRISONE) cream Apply to affected area 2 times daily 02/27/16 02/26/17  Charmayne Sheer Beers, PA-C  cyclobenzaprine (FLEXERIL) 10 MG tablet Take 1 tablet (10 mg total) by mouth 3 (three) times daily as needed for muscle spasms. 02/27/16   Charmayne Sheer Beers, PA-C  dextromethorphan-guaiFENesin (MUCINEX DM) 30-600 MG 12hr tablet Take 1 tablet by mouth 2 (two) times daily. 07/20/15   Christella Scheuermann, PA-C  ibuprofen (ADVIL,MOTRIN) 800 MG tablet Take 1 tablet (800 mg total) by mouth every 8 (eight) hours as needed. 02/27/16   Charmayne Sheer Beers, PA-C  predniSONE (STERAPRED UNI-PAK 21 TAB) 10 MG (21) TBPK tablet 6 tablets on day 1, 5 tablets on day 2, 4 tablets on day 3, etc... 09/11/15   Ignacia Bayley, PA-C    Allergies Review of patient's allergies indicates no known allergies.  History reviewed. No pertinent family  history.  Social History Social History  Substance Use Topics  . Smoking status: Current Some Day Smoker  . Smokeless tobacco: Never Used  . Alcohol use No    Review of Systems Constitutional: No fever/chills Eyes: No visual changes. ENT: No sore throat. Dental pain Cardiovascular: Denies chest pain. Respiratory: Denies shortness of breath. Gastrointestinal: No abdominal pain.  No nausea, no vomiting.  No diarrhea.  No constipation. Genitourinary: Negative for dysuria. Musculoskeletal: Negative for back pain. Skin: Negative for rash. Neurological: Negative for headaches, focal weakness or numbness.    ____________________________________________   PHYSICAL EXAM:  VITAL SIGNS: ED Triage Vitals [04/01/16 2247]  Enc Vitals Group     BP 110/79     Pulse Rate 87     Resp 16     Temp 98 F (36.7 C)     Temp Source Oral     SpO2 96 %     Weight 185 lb (83.9 kg)     Height 6\' 2"  (1.88 m)     Head Circumference      Peak Flow      Pain Score 6     Pain Loc      Pain Edu?      Excl. in GC?     Constitutional: Alert and oriented. Well appearing and in no acute distress. Eyes: Conjunctivae are normal. PERRL. EOMI. Head: Atraumatic. Nose: No congestion/rhinnorhea. Mouth/Throat: Mucous membranes are moist. Edematous gingiva tooth #15. Oropharynx non-erythematous. Neck: No stridor.  No cervical spine tenderness to palpation. Hematological/Lymphatic/Immunilogical: No cervical lymphadenopathy.  Cardiovascular: Normal rate, regular rhythm. Grossly normal heart sounds.  Good peripheral circulation. Respiratory: Normal respiratory effort.  No retractions. Lungs CTAB. Gastrointestinal: Soft and nontender. No distention. No abdominal bruits. No CVA tenderness. Musculoskeletal: No lower extremity tenderness nor edema.  No joint effusions. Neurologic:  Normal speech and language. No gross focal neurologic deficits are appreciated. No gait instability. Skin:  Skin is warm, dry and  intact. No rash noted. Psychiatric: Mood and affect are normal. Speech and behavior are normal.  ____________________________________________   LABS (all labs ordered are listed, but only abnormal results are displayed)  Labs Reviewed - No data to display ____________________________________________  EKG   ____________________________________________  RADIOLOGY   ____________________________________________   PROCEDURES  Procedure(s) performed: None  Procedures  Critical Care performed: No  ____________________________________________   INITIAL IMPRESSION / ASSESSMENT AND PLAN / ED COURSE  Pertinent labs & imaging results that were available during my care of the patient were reviewed by me and considered in my medical decision making (see chart for details). Dental abscess. Patient given discharge care instructions. Patient advised to follow-up from this incident claims provided. Patient started on amoxicillin, ibuprofen, and tramadol.  Clinical Course     ____________________________________________   FINAL CLINICAL IMPRESSION(S) / ED DIAGNOSES  Final diagnoses:  Dental abscess      NEW MEDICATIONS STARTED DURING THIS VISIT:  New Prescriptions   No medications on file     Note:  This document was prepared using Dragon voice recognition software and may include unintentional dictation errors.    Joni ReiningRonald K Smith, PA-C 04/01/16 [id number]    Emily FilbertJonathan E Williams, MD 04/02/16 [phone]

## 2016-04-01 NOTE — Discharge Instructions (Signed)
Follow-up from the list of dental clinics provided. °OPTIONS FOR DENTAL FOLLOW UP CARE ° °[place] Department of Health and Human Services - Local Safety Net Dental Clinics °http://www.ncdhhs.gov/dph/oralhealth/services/safetynetclinics.htm °  °[facility] (336-562-3123) ° °[facility] [place] ([phone]) ° °[facility] ([phone] ext [phone]) ° °[facility] ([phone]) ° °[facility] ([phone]) °This clinic caters to the indigent population and is on a lottery system. °Location: °[facility], [address], [place] °Clinic Hours: °Wednesdays from 6pm - 9pm, patients seen by a lottery system. °For dates, call or go to [email or web address].[facility].edu/[facility]/patients/Dental-[facility] °Services: °Cleanings, fillings and simple extractions. °Payment Options: °DENTAL WORK IS FREE OF CHARGE. Bring proof of income or support. °Best way to get seen: °Arrive at 5:15 pm - this is a lottery, NOT first come/first serve, so arriving earlier will not increase your chances of being seen. °  °  °[facility] °[phone] °Select option 1 for emergencies °  °Location: °[facility], [address], [place] °Clinic Hours: °No walk-ins accepted - call the day before to schedule an appointment. °Check in times are 9:30 am and 1:30 pm. °Services: °Simple extractions, temporary fillings, pulpectomy/pulp debridement, uncomplicated abscess drainage. °Payment Options: °PAYMENT IS DUE AT THE TIME OF SERVICE.  Fee is usually $100-200, additional surgical procedures (e.g. abscess drainage) may be extra. °Cash, checks, Visa/MasterCard accepted.  Can file Medicaid if patient is covered for dental - patient should call case worker to check. °No discount for [facility] patients. °Best way to get seen: °MUST call the day before and get onto the schedule. Can usually be seen the next 1-2 days. No walk-ins accepted. °  °   °[facility] °[phone] °  °Location: °[facility], [address], [place] °Clinic Hours: °M, W, Th, F 8am or 1:30pm, Tues 9a or 1:30 - first come/first served. °Services: °Simple extractions, temporary fillings, uncomplicated abscess drainage.  You do not need to be an [place] resident. °Payment Options: °PAYMENT IS DUE AT THE TIME OF SERVICE. °Dental insurance, otherwise sliding scale - bring proof of income or support. °Depending on income and treatment needed, cost is usually $50-200. °Best way to get seen: °Arrive early as it is first come/first served. °  °  °[facility] °[phone] °  °Location: °[address] °Clinic Hours: °Mon-Thu 8a-5p °Services: °Most basic dental services including extractions and fillings. °Payment Options: °PAYMENT IS DUE AT THE TIME OF SERVICE. °Sliding scale, up to 50% off - bring proof if income or support. °Medicaid with dental option accepted. °Best way to get seen: °Call to schedule an appointment, can usually be seen within 2 weeks OR they will try to see walk-ins - show up at 8a or 2p (you may have to wait). °  °  °[facility] °[phone] °[place] RESIDENTS ONLY °  °Location: °[facility], [address], [place], [place] [address] °Clinic Hours: By appointment only. °Monday - Thursday 8am-5pm, Friday 8am-12pm °Services: Cleanings, fillings, extractions. °Payment Options: °PAYMENT IS DUE AT THE TIME OF SERVICE. °Cash, Visa or MasterCard. Sliding scale - $30 minimum per service. °Best way to get seen: °Come in to office, complete packet and make an appointment - need proof of income °or support monies for each household member and proof of [place] residence. °Usually takes about a month to get in. °  °  °[facility] °[phone] °  °Location: °[address] [place] °Clinic Hours: Walk-in Urgent Care  Dental Services are offered Monday-Friday mornings only. °The numbers of emergencies accepted daily is limited to the number of °providers available. °Maximum 15 - Mondays, Wednesdays & Thursdays °Maximum 10 - Tuesdays & Fridays °Services: °You do not need to be a [place] resident to be seen for a dental emergency. °Emergencies are defined as pain, swelling, abnormal bleeding, or dental trauma. Walkins will receive x-rays if needed. °NOTE: Dental cleaning is not an emergency. °Payment Options: °PAYMENT IS DUE AT THE TIME OF SERVICE. °Minimum co-pay is $40.00 for uninsured patients. °Minimum co-pay is $3.00 for Medicaid with dental coverage. °Dental Insurance is accepted and must be presented at time of visit. °Medicare does not cover dental. °Forms of payment: Cash, credit card, checks. °Best way to get seen: °If not previously registered with the clinic, walk-in dental registration begins at 7:15 am and is on a first come/first serve basis. °If previously registered with the clinic, call to make an appointment. °  °  °[facility] °[phone] °[place] RESIDENTS ONLY °  °Location: °[address], [place], [place] °Clinic Hours: °Mon-Thu 10a-2p °Services: Extractions only! °Payment Options: °FREE (donations accepted) - bring proof of income or support °Best way to get seen: °Call and schedule an appointment OR come at 8am on the 1st Monday of every month (except for holidays) when it is first come/first served. °  °  °[facility] °[phone] °  °Location: °[address], [place] °Clinic Hours: °Friday mornings °Services, Payment Options, Best way to get seen: °Call for info ° °

## 2016-04-13 ENCOUNTER — Emergency Department
Admission: EM | Admit: 2016-04-13 | Discharge: 2016-04-13 | Disposition: A | Discharge status: Home / Self Care | Payer: Self-pay | Attending: Student in an Organized Health Care Education/Training Program | Admitting: Student in an Organized Health Care Education/Training Program

## 2016-04-13 ENCOUNTER — Encounter: Payer: Self-pay | Admitting: Emergency Medicine

## 2016-04-13 DIAGNOSIS — Z79899 Other long term (current) drug therapy: Secondary | ICD-10-CM | POA: Insufficient documentation

## 2016-04-13 DIAGNOSIS — Y9389 Activity, other specified: Secondary | ICD-10-CM | POA: Insufficient documentation

## 2016-04-13 DIAGNOSIS — F17200 Nicotine dependence, unspecified, uncomplicated: Secondary | ICD-10-CM | POA: Insufficient documentation

## 2016-04-13 DIAGNOSIS — S7011XA Contusion of right thigh, initial encounter: Secondary | ICD-10-CM | POA: Insufficient documentation

## 2016-04-13 DIAGNOSIS — M79604 Pain in right leg: Secondary | ICD-10-CM

## 2016-04-13 DIAGNOSIS — S01511A Laceration without foreign body of lip, initial encounter: Secondary | ICD-10-CM | POA: Insufficient documentation

## 2016-04-13 DIAGNOSIS — Y999 Unspecified external cause status: Secondary | ICD-10-CM | POA: Insufficient documentation

## 2016-04-13 DIAGNOSIS — Y92410 Unspecified street and highway as the place of occurrence of the external cause: Secondary | ICD-10-CM | POA: Insufficient documentation

## 2016-04-13 MED ORDER — AMOXICILLIN 500 MG PO TABS: 500.0000 mg | ORAL_TABLET | Freq: Three times a day (TID) | ORAL | 0 refills | Status: DC

## 2016-04-13 MED ORDER — NAPROXEN 500 MG PO TABS: 500.0000 mg | ORAL_TABLET | Freq: Two times a day (BID) | ORAL | 0 refills | Status: DC

## 2016-04-13 MED ORDER — TRAMADOL HCL 50 MG PO TABS: 50.0000 mg | ORAL_TABLET | Freq: Four times a day (QID) | ORAL | 0 refills | Status: DC | PRN
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
## 2016-04-13 NOTE — Discharge Instructions (Signed)
Rinse your mouth with warm salt water 4 times per day. Follow up with primary care or return to the ER for symptoms that change or worsen.
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
## 2016-08-09 ENCOUNTER — Emergency Department
Admission: EM | Admit: 2016-08-09 | Discharge: 2016-08-09 | Disposition: A | Discharge status: Home / Self Care | Payer: Self-pay | Attending: Emergency Medicine | Admitting: Emergency Medicine

## 2016-08-09 DIAGNOSIS — Z791 Long term (current) use of non-steroidal anti-inflammatories (NSAID): Secondary | ICD-10-CM | POA: Insufficient documentation

## 2016-08-09 DIAGNOSIS — F17200 Nicotine dependence, unspecified, uncomplicated: Secondary | ICD-10-CM | POA: Insufficient documentation

## 2016-08-09 DIAGNOSIS — K0381 Cracked tooth: Secondary | ICD-10-CM | POA: Insufficient documentation

## 2016-08-09 DIAGNOSIS — K029 Dental caries, unspecified: Secondary | ICD-10-CM | POA: Insufficient documentation

## 2016-08-09 MED ORDER — IBUPROFEN 600 MG PO TABS: 600.0000 mg | ORAL_TABLET | Freq: Three times a day (TID) | ORAL | 0 refills | Status: DC | PRN

## 2016-08-09 MED ORDER — AMOXICILLIN 500 MG PO CAPS: 500.0000 mg | ORAL_CAPSULE | Freq: Three times a day (TID) | ORAL | 0 refills | Status: DC

## 2016-08-09 MED ORDER — TRAMADOL HCL 50 MG PO TABS: 50.0000 mg | ORAL_TABLET | Freq: Four times a day (QID) | ORAL | 0 refills | Status: DC | PRN

## 2016-08-09 NOTE — ED Provider Notes (Signed)
[facility] Emergency Department Provider Note   ____________________________________________   None    (approximate)  I have reviewed the triage vital signs and the nursing notes.   HISTORY  Chief Complaint Dental Pain    HPI Stanley Cobb is a 36 y.o. male patient complaining of dental pain for 2 days. Patient has a history of devitalized teeth and dental pain. Patient state he does not have a dentist. Patient rates his pain as a 10 over 10. No palliative measures taken for this complaint.   History reviewed. No pertinent past medical history.  There are no active problems to display for this patient.   History reviewed. No pertinent surgical history.  Prior to Admission medications   Medication Sig Start Date End Date Taking? Authorizing Provider  amoxicillin (AMOXIL) 500 MG capsule Take 1 capsule (500 mg total) by mouth 3 (three) times daily. 08/09/16   Joni Reiningonald K Vadim Centola, PA-C  amoxicillin (AMOXIL) 500 MG tablet Take 1 tablet (500 mg total) by mouth 3 (three) times daily. 04/13/16   Chinita Pesterari B Triplett, FNP  clotrimazole-betamethasone (LOTRISONE) cream Apply to affected area 2 times daily 02/27/16 02/26/17  Charmayne Sheerharles M Beers, PA-C  cyclobenzaprine (FLEXERIL) 10 MG tablet Take 1 tablet (10 mg total) by mouth 3 (three) times daily as needed for muscle spasms. 02/27/16   Charmayne Sheerharles M Beers, PA-C  dextromethorphan-guaiFENesin (MUCINEX DM) 30-600 MG 12hr tablet Take 1 tablet by mouth 2 (two) times daily. 07/20/15   Christella ScheuermannEmma V Lawrence, PA-C  ibuprofen (ADVIL,MOTRIN) 600 MG tablet Take 1 tablet (600 mg total) by mouth every 8 (eight) hours as needed. 08/09/16   Joni Reiningonald K Justen Fonda, PA-C  lidocaine (XYLOCAINE) 2 % solution Use as directed 5 mLs in the mouth or throat every 6 (six) hours as needed for mouth pain. 04/01/16   Joni Reiningonald K Christel Bai, PA-C  naproxen (NAPROSYN) 500 MG tablet Take 1 tablet (500 mg total) by mouth 2 (two) times daily with a meal. 04/13/16   Cari B Triplett, FNP    traMADol (ULTRAM) 50 MG tablet Take 1 tablet (50 mg total) by mouth every 6 (six) hours as needed. 04/13/16   Chinita Pesterari B Triplett, FNP  traMADol (ULTRAM) 50 MG tablet Take 1 tablet (50 mg total) by mouth every 6 (six) hours as needed. 08/09/16 08/09/17  Joni Reiningonald K Triton Heidrich, PA-C    Allergies Patient has no known allergies.  No family history on file.  Social History Social History  Substance Use Topics  . Smoking status: Current Some Day Smoker  . Smokeless tobacco: Never Used  . Alcohol use No    Review of Systems Constitutional: No fever/chills Eyes: No visual changes. UJW:JXBJYNENT:Dental pain Cardiovascular: Denies chest pain. Respiratory: Denies shortness of breath. Gastrointestinal: No abdominal pain.  No nausea, no vomiting.  No diarrhea.  No constipation. Genitourinary: Negative for dysuria. Musculoskeletal: Negative for back pain. Skin: Negative for rash. Neurological: Negative for headaches, focal weakness or numbness.   ____________________________________________   PHYSICAL EXAM:  VITAL SIGNS: ED Triage Vitals  Enc Vitals Group     BP 08/09/16 1446 128/77     Pulse Rate 08/09/16 1446 (!) 105     Resp 08/09/16 1446 18     Temp 08/09/16 1446 99 F (37.2 C)     Temp Source 08/09/16 1446 Oral     SpO2 08/09/16 1446 100 %     Weight 08/09/16 1447 215 lb (97.5 kg)     Height 08/09/16 1447 6\' 2"  (1.88 m)  Head Circumference --      Peak Flow --      Pain Score 08/09/16 1447 10     Pain Loc --      Pain Edu? --      Excl. in GC? --     Constitutional: Alert and oriented. Well appearing and in no acute distress. Eyes: Conjunctivae are normal. PERRL. EOMI. Head: Atraumatic. Nose: No congestion/rhinnorhea. Mouth/Throat: Mucous membranes are moist.  Oropharynx non-erythematous. Fracture tooth #28 Neck: No stridor.  No cervical spine tenderness to palpation. Hematological/Lymphatic/Immunilogical: No cervical lymphadenopathy. Cardiovascular: Normal rate, regular rhythm. Grossly  normal heart sounds.  Good peripheral circulation. Respiratory: Normal respiratory effort.  No retractions. Lungs CTAB. Gastrointestinal: Soft and nontender. No distention. No abdominal bruits. No CVA tenderness. Musculoskeletal: No lower extremity tenderness nor edema.  No joint effusions. Neurologic:  Normal speech and language. No gross focal neurologic deficits are appreciated. No gait instability. Skin:  Skin is warm, dry and intact. No rash noted. Psychiatric: Mood and affect are normal. Speech and behavior are normal.  ____________________________________________   LABS (all labs ordered are listed, but only abnormal results are displayed)  Labs Reviewed - No data to display ____________________________________________  EKG   ____________________________________________  RADIOLOGY   ____________________________________________   PROCEDURES  Procedure(s) performed: None  Procedures  Critical Care performed: No  ____________________________________________   INITIAL IMPRESSION / ASSESSMENT AND PLAN / ED COURSE  Pertinent labs & imaging results that were available during my care of the patient were reviewed by me and considered in my medical decision making (see chart for details).  Dental pain secondary to fractured tooth. Patient given discharge care instruction. Patient given a list of dental clinic for follow-up care. Patient discharged with amoxicillin, ibuprofen, and tramadol.  Clinical Course      ____________________________________________   FINAL CLINICAL IMPRESSION(S) / ED DIAGNOSES  Final diagnoses:  Pain due to dental caries      NEW MEDICATIONS STARTED DURING THIS VISIT:  New Prescriptions   AMOXICILLIN (AMOXIL) 500 MG CAPSULE    Take 1 capsule (500 mg total) by mouth 3 (three) times daily.   IBUPROFEN (ADVIL,MOTRIN) 600 MG TABLET    Take 1 tablet (600 mg total) by mouth every 8 (eight) hours as needed.   TRAMADOL (ULTRAM) 50 MG  TABLET    Take 1 tablet (50 mg total) by mouth every 6 (six) hours as needed.     Note:  This document was prepared using Dragon voice recognition software and may include unintentional dictation errors.    Joni Reining, PA-C 08/09/16 1508    Myrna Blazer, MD 08/09/16 [phone]

## 2016-08-09 NOTE — ED Triage Notes (Signed)
Dental pain to right lower side of mouth. Hx of similar toothaches.

## 2016-11-03 ENCOUNTER — Encounter: Payer: Self-pay | Admitting: Emergency Medicine

## 2016-11-03 ENCOUNTER — Emergency Department
Admission: EM | Admit: 2016-11-03 | Discharge: 2016-11-03 | Disposition: A | Discharge status: Home / Self Care | Payer: Self-pay | Attending: Emergency Medicine | Admitting: Emergency Medicine

## 2016-11-03 DIAGNOSIS — F17200 Nicotine dependence, unspecified, uncomplicated: Secondary | ICD-10-CM | POA: Insufficient documentation

## 2016-11-03 DIAGNOSIS — L409 Psoriasis, unspecified: Secondary | ICD-10-CM | POA: Insufficient documentation

## 2016-11-03 HISTORY — DX: Psoriasis, unspecified: L40.9

## 2016-11-03 MED ORDER — TRIAMCINOLONE ACETONIDE 0.025 % EX OINT: 1.0000 "application " | TOPICAL_OINTMENT | Freq: Two times a day (BID) | CUTANEOUS | 0 refills | Status: DC

## 2016-11-03 NOTE — ED Provider Notes (Signed)
[facility] Emergency Department Provider Note  ____________________________________________  Time seen: Approximately 6:40 PM  I have reviewed the triage vital signs and the nursing notes.   HISTORY  Chief Complaint Rash    HPI Stanley Cobb is a 36 y.o. male that presents to the emergency department with psoriasis on arms and he is out of his triamcinolone ointment. He states the rash itches. Patient states that he has hadpsoriasis since he was 23. Patient states that it occasionally flares up and triamcinolone ointment takes care of it. He states that he cannot afford the dermatologist. He denies fever, shortness of breath, chest pain, nausea, vomiting, abdominal pain.   Past Medical History:  Diagnosis Date  . Psoriasis     There are no active problems to display for this patient.   History reviewed. No pertinent surgical history.  Prior to Admission medications   Medication Sig Start Date End Date Taking? Authorizing Provider  amoxicillin (AMOXIL) 500 MG capsule Take 1 capsule (500 mg total) by mouth 3 (three) times daily. 08/09/16   Joni Reining, PA-C  amoxicillin (AMOXIL) 500 MG tablet Take 1 tablet (500 mg total) by mouth 3 (three) times daily. 04/13/16   Chinita Pester, FNP  clotrimazole-betamethasone (LOTRISONE) cream Apply to affected area 2 times daily 02/27/16 02/26/17  Charmayne Sheer Beers, PA-C  cyclobenzaprine (FLEXERIL) 10 MG tablet Take 1 tablet (10 mg total) by mouth 3 (three) times daily as needed for muscle spasms. 02/27/16   Charmayne Sheer Beers, PA-C  dextromethorphan-guaiFENesin (MUCINEX DM) 30-600 MG 12hr tablet Take 1 tablet by mouth 2 (two) times daily. 07/20/15   Christella Scheuermann, PA-C  ibuprofen (ADVIL,MOTRIN) 600 MG tablet Take 1 tablet (600 mg total) by mouth every 8 (eight) hours as needed. 08/09/16   Joni Reining, PA-C  lidocaine (XYLOCAINE) 2 % solution Use as directed 5 mLs in the mouth or throat every 6 (six) hours as needed for  mouth pain. 04/01/16   Joni Reining, PA-C  naproxen (NAPROSYN) 500 MG tablet Take 1 tablet (500 mg total) by mouth 2 (two) times daily with a meal. 04/13/16   Cari B Triplett, FNP  traMADol (ULTRAM) 50 MG tablet Take 1 tablet (50 mg total) by mouth every 6 (six) hours as needed. 04/13/16   Chinita Pester, FNP  traMADol (ULTRAM) 50 MG tablet Take 1 tablet (50 mg total) by mouth every 6 (six) hours as needed. 08/09/16 08/09/17  Joni Reining, PA-C  triamcinolone (KENALOG) 0.025 % ointment Apply 1 application topically 2 (two) times daily. 11/03/16   Enid Derry, PA-C    Allergies Patient has no known allergies.  No family history on file.  Social History Social History  Substance Use Topics  . Smoking status: Current Some Day Smoker  . Smokeless tobacco: Never Used  . Alcohol use No     Review of Systems  Constitutional: No fever/chills Cardiovascular: No chest pain. Respiratory: No cough. No SOB. Gastrointestinal: No abdominal pain.  No nausea, no vomiting.  Musculoskeletal: Negative for musculoskeletal pain. Skin: Negative for abrasions, lacerations, ecchymosis. Positive for rash. Neurological: Negative for headaches, numbness or tingling   ____________________________________________   PHYSICAL EXAM:  VITAL SIGNS: ED Triage Vitals  Enc Vitals Group     BP 11/03/16 1750 130/82     Pulse Rate 11/03/16 1750 78     Resp 11/03/16 1750 18     Temp 11/03/16 1750 97.7 F (36.5 C)     Temp Source  11/03/16 1750 Oral     SpO2 11/03/16 1750 95 %     Weight 11/03/16 1751 210 lb (95.3 kg)     Height 11/03/16 1751  (1.88 m)     Head Circumference --      Peak Flow --      Pain Score 11/03/16 1752 8     Pain Loc --      Pain Edu? --      Excl. in GC? --     Eyes: Conjunctivae are normal. PERRL. EOMI. Head: Atraumatic. ENT:      Ears:      Nose: No congestion/rhinnorhea.      Mouth/Throat: Mucous membranes are moist.  Neck: No stridor.  Cardiovascular: Normal rate,  regular rhythm.  Good peripheral circulation. Respiratory: Normal respiratory effort without tachypnea or retractions. Lungs CTAB. Good air entry to the bases with no decreased or absent breath sounds. Musculoskeletal: Full range of motion to all extremities. No gross deformities appreciated. Neurologic:  Normal speech and language. No gross focal neurologic deficits are appreciated.  Skin:  Skin is warm, dry and intact. Several 1 centimeter scaly patches over bilateral arms.   ____________________________________________   LABS (all labs ordered are listed, but only abnormal results are displayed)  Labs Reviewed - No data to display ____________________________________________  EKG   ____________________________________________  RADIOLOGY  No results found.  ____________________________________________    PROCEDURES  Procedure(s) performed:    Procedures    Medications - No data to display   ____________________________________________   INITIAL IMPRESSION / ASSESSMENT AND PLAN / ED COURSE  Pertinent labs & imaging results that were available during my care of the patient were reviewed by me and considered in my medical decision making (see chart for details).  Review of the [place] CSRS was performed in accordance of the [facility] prior to dispensing any controlled drugs.     Patient's diagnosis is consistent with psoriasis. Vital signs and exam are reassuring. Patient will be discharged home with prescriptions for triamcinolone ointment. Patient is to follow up with dermatology as directed. Patient is given ED precautions to return to the ED for any worsening or new symptoms.     ____________________________________________  FINAL CLINICAL IMPRESSION(S) / ED DIAGNOSES  Final diagnoses:  Psoriasis      NEW MEDICATIONS STARTED DURING THIS VISIT:  Discharge Medication List as of 11/03/2016  6:29 PM    START taking these medications   Details   triamcinolone (KENALOG) 0.025 % ointment Apply 1 application topically 2 (two) times daily., Starting Fri 11/03/2016, Print            This chart was dictated using voice recognition software/Dragon. Despite best efforts to proofread, errors can occur which can change the meaning. Any change was purely unintentional.    Enid Derry, PA-C 11/03/16 1845    Governor Rooks, MD 11/04/16 [phone]

## 2016-11-03 NOTE — ED Triage Notes (Signed)
Patient presents to the ED with redness bilaterally to arms in small patches.  Patient is in no obvious distress at this time.  Patient reports history of psoriasis.

## 2016-11-15 ENCOUNTER — Encounter: Payer: Self-pay | Admitting: Emergency Medicine

## 2016-11-15 ENCOUNTER — Emergency Department
Admission: EM | Admit: 2016-11-15 | Discharge: 2016-11-15 | Disposition: A | Discharge status: Home / Self Care | Payer: Self-pay | Attending: Emergency Medicine | Admitting: Emergency Medicine

## 2016-11-15 DIAGNOSIS — L409 Psoriasis, unspecified: Secondary | ICD-10-CM | POA: Insufficient documentation

## 2016-11-15 DIAGNOSIS — F17200 Nicotine dependence, unspecified, uncomplicated: Secondary | ICD-10-CM | POA: Insufficient documentation

## 2016-11-15 MED ORDER — TRIAMCINOLONE ACETONIDE 0.1 % EX CREA: 1.0000 "application " | TOPICAL_CREAM | Freq: Four times a day (QID) | CUTANEOUS | 1 refills | Status: DC

## 2016-11-15 MED ORDER — CALCIPOTRIENE 0.005 % EX CREA: TOPICAL_CREAM | Freq: Two times a day (BID) | CUTANEOUS | 1 refills | Status: DC

## 2016-11-15 NOTE — ED Triage Notes (Signed)
Pt has psorasis for 1 month.  Rash on arms and legs.  Pt has itching.  Pt out of meds for 2 days.  Pt alert.

## 2016-11-15 NOTE — ED Provider Notes (Signed)
[facility] Emergency Department Provider Note  ____________________________________________  Time seen: Approximately 6:54 PM  I have reviewed the triage vital signs and the nursing notes.   HISTORY  Chief Complaint Rash    HPI Stanley Cobb is a 36 y.o. male who presents emergency department complaining of psoriasis outbreak. Patient reports that he experiences increases in his psoriasis couple times a year. He reports that triamcinolone this typically exceptionally effective for this. Patient was seen in this department recently for similar complaint but states that he ran out of triamcinolone before symptoms had fully alleviated. Patient is requesting a prescription of triamcinolone. No other complaints at this time.   Past Medical History:  Diagnosis Date  . Psoriasis     There are no active problems to display for this patient.   History reviewed. No pertinent surgical history.  Prior to Admission medications   Medication Sig Start Date End Date Taking? Authorizing Provider  amoxicillin (AMOXIL) 500 MG capsule Take 1 capsule (500 mg total) by mouth 3 (three) times daily. 08/09/16   Joni Reining, PA-C  amoxicillin (AMOXIL) 500 MG tablet Take 1 tablet (500 mg total) by mouth 3 (three) times daily. 04/13/16   Chinita Pester, FNP  clotrimazole-betamethasone (LOTRISONE) cream Apply to affected area 2 times daily 02/27/16 02/26/17  Charmayne Sheer Beers, PA-C  cyclobenzaprine (FLEXERIL) 10 MG tablet Take 1 tablet (10 mg total) by mouth 3 (three) times daily as needed for muscle spasms. 02/27/16   Charmayne Sheer Beers, PA-C  dextromethorphan-guaiFENesin (MUCINEX DM) 30-600 MG 12hr tablet Take 1 tablet by mouth 2 (two) times daily. 07/20/15   Christella Scheuermann, PA-C  ibuprofen (ADVIL,MOTRIN) 600 MG tablet Take 1 tablet (600 mg total) by mouth every 8 (eight) hours as needed. 08/09/16   Joni Reining, PA-C  lidocaine (XYLOCAINE) 2 % solution Use as directed 5 mLs in the  mouth or throat every 6 (six) hours as needed for mouth pain. 04/01/16   Joni Reining, PA-C  naproxen (NAPROSYN) 500 MG tablet Take 1 tablet (500 mg total) by mouth 2 (two) times daily with a meal. 04/13/16   Cari B Triplett, FNP  traMADol (ULTRAM) 50 MG tablet Take 1 tablet (50 mg total) by mouth every 6 (six) hours as needed. 04/13/16   Chinita Pester, FNP  traMADol (ULTRAM) 50 MG tablet Take 1 tablet (50 mg total) by mouth every 6 (six) hours as needed. 08/09/16 08/09/17  Joni Reining, PA-C  triamcinolone (KENALOG) 0.025 % ointment Apply 1 application topically 2 (two) times daily. 11/03/16   Enid Derry, PA-C    Allergies Patient has no known allergies.  No family history on file.  Social History Social History  Substance Use Topics  . Smoking status: Current Some Day Smoker  . Smokeless tobacco: Never Used  . Alcohol use No     Review of Systems  Constitutional: No fever/chills Cardiovascular: no chest pain. Respiratory: no cough. No SOB. Musculoskeletal: Negative for musculoskeletal pain. Skin: Positive for psoriasis to forehead, trunk, all 4 extremities. Neurological: Negative for headaches, focal weakness or numbness. 10-point ROS otherwise negative.  ____________________________________________   PHYSICAL EXAM:  VITAL SIGNS: ED Triage Vitals [11/15/16 1820]  Enc Vitals Group     BP 138/89     Pulse Rate 85     Resp 18     Temp 98.3 F (36.8 C)     Temp Source Oral     SpO2 99 %  Weight 210 lb (95.3 kg)     Height  (1.88 m)     Head Circumference      Peak Flow      Pain Score      Pain Loc      Pain Edu?      Excl. in GC?      Constitutional: Alert and oriented. Well appearing and in no acute distress. Eyes: Conjunctivae are normal. PERRL. EOMI. Head: Atraumatic. Neck: No stridor.    Cardiovascular: Normal rate, regular rhythm. Normal S1 and S2.  Good peripheral circulation. Respiratory: Normal respiratory effort without tachypnea or  retractions. Lungs CTAB. Good air entry to the bases with no decreased or absent breath sounds. Musculoskeletal: Full range of motion to all extremities. No gross deformities appreciated. Neurologic:  Normal speech and language. No gross focal neurologic deficits are appreciated. Scattered plaque-like lesions across forehead, trunk, bilateral upper extremities. Lesions are consistent with psoriasis. Skin:  Skin is warm, dry and intact. No rash noted. Psychiatric: Mood and affect are normal. Speech and behavior are normal. Patient exhibits appropriate insight and judgement.   ____________________________________________   LABS (all labs ordered are listed, but only abnormal results are displayed)  Labs Reviewed - No data to display ____________________________________________  EKG   ____________________________________________  RADIOLOGY   No results found.  ____________________________________________    PROCEDURES  Procedure(s) performed:    Procedures    Medications - No data to display   ____________________________________________   INITIAL IMPRESSION / ASSESSMENT AND PLAN / ED COURSE  Pertinent labs & imaging results that were available during my care of the patient were reviewed by me and considered in my medical decision making (see chart for details).  Review of the [place] CSRS was performed in accordance of the [facility] prior to dispensing any controlled drugs.     Patient's diagnosis is consistent with psoriasis. Patient is requesting topicals for psoriasis. Patient is given prescription for triamcinolone and dovonox cream.. Patient will follow-up with primary care as needed. Patient is given ED precautions to return to the ED for any worsening or new symptoms.     ____________________________________________  FINAL CLINICAL IMPRESSION(S) / ED DIAGNOSES  Final diagnoses:  None      NEW MEDICATIONS STARTED DURING THIS VISIT:  New Prescriptions    No medications on file        This chart was dictated using voice recognition software/Dragon. Despite best efforts to proofread, errors can occur which can change the meaning. Any change was purely unintentional.    Racheal Patches, PA-C 11/15/16 1905    Nita Sickle, MD 11/18/16 [phone]

## 2016-11-16 ENCOUNTER — Encounter: Payer: Self-pay | Admitting: Emergency Medicine

## 2016-11-16 ENCOUNTER — Emergency Department
Admission: EM | Admit: 2016-11-16 | Discharge: 2016-11-16 | Disposition: A | Discharge status: Home / Self Care | Payer: Self-pay | Attending: Emergency Medicine | Admitting: Emergency Medicine

## 2016-11-16 DIAGNOSIS — L409 Psoriasis, unspecified: Secondary | ICD-10-CM

## 2016-11-16 DIAGNOSIS — F17200 Nicotine dependence, unspecified, uncomplicated: Secondary | ICD-10-CM | POA: Insufficient documentation

## 2016-11-16 MED ORDER — TRIAMCINOLONE ACETONIDE 0.5 % EX OINT: 1.0000 "application " | TOPICAL_OINTMENT | Freq: Two times a day (BID) | CUTANEOUS | 0 refills | Status: DC

## 2016-11-16 NOTE — ED Notes (Signed)
Patient has rash to bilateral arms and legs. Patient reports he had asked for ointment, and was prescribed cream. Pt is requesting largest available tube of medication.    Medication requested is Tramcinolone.

## 2016-11-16 NOTE — ED Provider Notes (Signed)
  [facility]-EMERGENCY DEPARTMENT Provider Note   CSN: [id number] Arrival date & time: 11/16/16  2000     History   Chief Complaint Chief Complaint  Patient presents with  . Rash    HPI Stanley Cobb is a 36 y.o. male presents today for evaluation of psoriasis rash. He was seen yesterday in the emergency department prescribed triamcinolone cream. He is requesting an appointment today. Patient has a history of psoriasis that has responded well to ointment in the past. Psoriasis is on is extensor surfaces of the upper and lower extremities.  HPI  Past Medical History:  Diagnosis Date  . Psoriasis     There are no active problems to display for this patient.   History reviewed. No pertinent surgical history.     Home Medications    Prior to Admission medications   Medication Sig Start Date End Date Taking? Authorizing Provider  calcipotriene (DOVONOX) 0.005 % cream Apply topically 2 (two) times daily. 11/15/16   Delorise Royals Cuthriell, PA-C  triamcinolone ointment (KENALOG) 0.5 % Apply 1 application topically 2 (two) times daily. 11/16/16   Evon Slack, PA-C    Family History No family history on file.  Social History Social History  Substance Use Topics  . Smoking status: Current Some Day Smoker  . Smokeless tobacco: Never Used  . Alcohol use No     Allergies   Patient has no known allergies.   Review of Systems Review of Systems  Constitutional: Negative for fever.  Skin: Positive for rash.     Physical Exam Updated Vital Signs BP (!) 188/88   Pulse 89   Temp 98.7 F (37.1 C) (Oral)   Resp 18   Ht  (1.88 m)   Wt 95.3 kg   SpO2 100%   BMI 26.96 kg/m   Physical Exam  Constitutional: He appears well-developed and well-nourished.  HENT:  Head: Normocephalic and atraumatic.  Right Ear: External ear normal.  Left Ear: External ear normal.  Eyes: EOM are normal. Pupils are equal, round, and reactive to light.  Neck: Normal range of motion.    Cardiovascular: Normal rate.   Pulmonary/Chest: No respiratory distress.  Skin:  Patient with inflamed scaly plaques around the extensor surfaces of the elbows and lower extremities. No surrounding cellulitis.     ED Treatments / Results  Labs (all labs ordered are listed, but only abnormal results are displayed) Labs Reviewed - No data to display  EKG  EKG Interpretation None       Radiology No results found.  Procedures Procedures (including critical care time)  Medications Ordered in ED Medications - No data to display   Initial Impression / Assessment and Plan / ED Course  I have reviewed the triage vital signs and the nursing notes.  Pertinent labs & imaging results that were available during my care of the patient were reviewed by me and considered in my medical decision making (see chart for details).     Patient given a prescription for triamcinolone ointment. Will follow up as needed.  Final Clinical Impressions(s) / ED Diagnoses   Final diagnoses:  Psoriasis    New Prescriptions New Prescriptions   TRIAMCINOLONE OINTMENT (KENALOG) 0.5 %    Apply 1 application topically 2 (two) times daily.     Evon Slack, PA-C 11/16/16 2121    Phineas Semen, MD 11/16/16 2212

## 2016-11-16 NOTE — ED Triage Notes (Signed)
Patient states that he was seen here yesterday for psoriasis and was prescribed a topical cream. Patient states that he wanted the ointment prescribed because it works better for him.

## 2016-12-04 ENCOUNTER — Emergency Department
Admission: EM | Admit: 2016-12-04 | Discharge: 2016-12-04 | Disposition: A | Discharge status: Home / Self Care | Payer: Self-pay | Attending: Emergency Medicine | Admitting: Emergency Medicine

## 2016-12-04 ENCOUNTER — Encounter: Payer: Self-pay | Admitting: Emergency Medicine

## 2016-12-04 DIAGNOSIS — L409 Psoriasis, unspecified: Secondary | ICD-10-CM | POA: Insufficient documentation

## 2016-12-04 DIAGNOSIS — F17200 Nicotine dependence, unspecified, uncomplicated: Secondary | ICD-10-CM | POA: Insufficient documentation

## 2016-12-04 DIAGNOSIS — Z79899 Other long term (current) drug therapy: Secondary | ICD-10-CM | POA: Insufficient documentation

## 2016-12-04 MED ORDER — BETAMETHASONE DIPROPIONATE AUG 0.05 % EX OINT: TOPICAL_OINTMENT | Freq: Two times a day (BID) | CUTANEOUS | 1 refills | Status: DC

## 2016-12-04 NOTE — ED Triage Notes (Signed)
Patient having a psoriasis flare up.

## 2016-12-04 NOTE — Discharge Instructions (Signed)
In addition to the topical steroid ointment, your should take care to use a daily skin emollient like Eucerin or petroleum jelly. Avoid excessive dry skin. Moisturize well after baths. Avoid hot, prolonged showers. It is important that you select and follow-up with one of the local community clinics for routine medical care.

## 2016-12-04 NOTE — ED Notes (Signed)
Seen here before. Rash right arm and stomach. NAD

## 2016-12-10 NOTE — ED Provider Notes (Signed)
[facility] Emergency Department Provider Note ____________________________________________  Time seen: 1640  I have reviewed the triage vital signs and the nursing notes.  HISTORY  Chief Complaint  Rash  HPI Stanley Cobb is a 36 y.o. male Presents to the ED for request for a refills of his steroid cream for his psoriasis. Patient was seen in ED several times for flare of psoriasis. 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 He denies any Recent injury, illness, fevers, chills, sweats. She denies any purulent drainage or local infection to his skin patches. He reports some improvement of his psoriatic patches with the prescription recently prescribed. He has however run out of that prescription.  Past Medical History:  Diagnosis Date  . Psoriasis     There are no active problems to display for this patient.   History reviewed. No pertinent surgical history.  Prior to Admission medications   Medication Sig Start Date End Date Taking? Authorizing Provider  augmented betamethasone dipropionate (DIPROLENE-AF) 0.05 % ointment Apply topically 2 (two) times daily. 12/04/16   Deunta Beneke, Charlesetta Ivory, PA-C  calcipotriene (DOVONOX) 0.005 % cream Apply topically 2 (two) times daily. 11/15/16   Cuthriell, Delorise Royals, PA-C  triamcinolone ointment (KENALOG) 0.5 % Apply 1 application topically 2 (two) times daily. 11/16/16   Evon Slack, PA-C    Allergies Patient has no known allergies.  No family history on file.  Social History Social History  Substance Use Topics  . Smoking status: Current Some Day Smoker  . Smokeless tobacco: Never Used  . Alcohol use No    Review of Systems  Constitutional: Negative for fever. Eyes: Negative for visual changes. ENT: Negative for sore throat. Cardiovascular: Negative for chest pain. Respiratory: Negative for shortness of breath. Musculoskeletal: Negative for back  pain. Skin: Positive for rash. Neurological: Negative for headaches, focal weakness or numbness. ____________________________________________  PHYSICAL EXAM:  VITAL SIGNS: ED Triage Vitals  Enc Vitals Group     BP 12/04/16 1534 (!) 141/90     Pulse Rate 12/04/16 1534 62     Resp 12/04/16 1534 16     Temp 12/04/16 1534 99.2 F (37.3 C)     Temp Source 12/04/16 1534 Oral     SpO2 12/04/16 1534 98 %     Weight 12/04/16 1532 210 lb (95.3 kg)     Height 12/04/16 1532 6\' 2"  (1.88 m)     Head Circumference --      Peak Flow --      Pain Score 12/04/16 1532 0     Pain Loc --      Pain Edu? --      Excl. in GC? --     Constitutional: Alert and oriented. Well appearing and in no distress. Head: Normocephalic and atraumatic. Hematological/Lymphatic/Immunological: No cervical lymphadenopathy. Cardiovascular: Normal rate, regular rhythm. Normal distal pulses. Respiratory: Normal respiratory effort.  Musculoskeletal: Nontender with normal range of motion in all extremities.  Neurologic:  Normal gait without ataxia. Normal speech and language. No gross focal neurologic deficits are appreciated. Skin:  Skin is warm, dry and intact. Patient with silvery scaled maculopapular patches to the arms and torso consistent with psoriasis. No signs of secondary infection noted.  ____________________________________________  INITIAL IMPRESSION / ASSESSMENT AND PLAN / ED COURSE  Patient with chronic psoriatic dermatitis, with presentation for medication refill.He is discharged at this time with a prescription for Diprolene-AF ointment to dose as directed. He  is also given a refill for this prescription. Patient is also given a referral to the community clinics so that he may establish care for ongoing management of his symptoms. He is advised to avoid excessive dry skin and moisturize well after baths.  ____________________________________________  FINAL CLINICAL IMPRESSION(S) / ED DIAGNOSES  Final  diagnoses:  Psoriasis      Zaeda Mcferran, Charlesetta IvoryJenise V Bacon, PA-C 12/10/16 0115    Merrily Brittleifenbark, Neil, MD 12/10/16 1127

## 2016-12-25 ENCOUNTER — Encounter: Payer: Self-pay | Admitting: Emergency Medicine

## 2016-12-25 ENCOUNTER — Emergency Department: Payer: No Typology Code available for payment source

## 2016-12-25 ENCOUNTER — Emergency Department
Admission: EM | Admit: 2016-12-25 | Discharge: 2016-12-25 | Disposition: A | Discharge status: Home / Self Care | Payer: No Typology Code available for payment source | Attending: Emergency Medicine | Admitting: Emergency Medicine

## 2016-12-25 DIAGNOSIS — Y999 Unspecified external cause status: Secondary | ICD-10-CM | POA: Diagnosis not present

## 2016-12-25 DIAGNOSIS — F17200 Nicotine dependence, unspecified, uncomplicated: Secondary | ICD-10-CM | POA: Insufficient documentation

## 2016-12-25 DIAGNOSIS — S39012A Strain of muscle, fascia and tendon of lower back, initial encounter: Secondary | ICD-10-CM | POA: Diagnosis not present

## 2016-12-25 DIAGNOSIS — Y939 Activity, unspecified: Secondary | ICD-10-CM | POA: Insufficient documentation

## 2016-12-25 DIAGNOSIS — Y92410 Unspecified street and highway as the place of occurrence of the external cause: Secondary | ICD-10-CM | POA: Insufficient documentation

## 2016-12-25 DIAGNOSIS — S3992XA Unspecified injury of lower back, initial encounter: Secondary | ICD-10-CM | POA: Diagnosis present

## 2016-12-25 MED ORDER — CYCLOBENZAPRINE HCL 5 MG PO TABS: 5.0000 mg | ORAL_TABLET | Freq: Three times a day (TID) | ORAL | 0 refills | Status: DC | PRN

## 2016-12-25 MED ORDER — KETOROLAC TROMETHAMINE 30 MG/ML IJ SOLN
30.0000 mg | Freq: Once | INTRAMUSCULAR | Status: AC
  Administered 2016-12-25: 30 mg via INTRAMUSCULAR
  Filled 2016-12-25: qty 1

## 2016-12-25 NOTE — Discharge Instructions (Signed)
Take over-the-counter ibuprofen or Aleve for symptom management in addition to prescribed Flexeril as needed.
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
## 2017-02-15 ENCOUNTER — Encounter: Payer: Self-pay | Admitting: Emergency Medicine

## 2017-02-15 ENCOUNTER — Emergency Department
Admission: EM | Admit: 2017-02-15 | Discharge: 2017-02-15 | Disposition: A | Discharge status: Home / Self Care | Payer: Self-pay | Attending: Emergency Medicine | Admitting: Emergency Medicine

## 2017-02-15 DIAGNOSIS — F17200 Nicotine dependence, unspecified, uncomplicated: Secondary | ICD-10-CM | POA: Insufficient documentation

## 2017-02-15 DIAGNOSIS — Z79899 Other long term (current) drug therapy: Secondary | ICD-10-CM | POA: Insufficient documentation

## 2017-02-15 DIAGNOSIS — L409 Psoriasis, unspecified: Secondary | ICD-10-CM | POA: Insufficient documentation

## 2017-02-15 DIAGNOSIS — Z760 Encounter for issue of repeat prescription: Secondary | ICD-10-CM | POA: Insufficient documentation

## 2017-02-15 DIAGNOSIS — Z9119 Patient's noncompliance with other medical treatment and regimen: Secondary | ICD-10-CM | POA: Insufficient documentation

## 2017-02-15 MED ORDER — BETAMETHASONE DIPROPIONATE AUG 0.05 % EX OINT: TOPICAL_OINTMENT | Freq: Two times a day (BID) | CUTANEOUS | 2 refills | Status: DC

## 2017-02-15 NOTE — ED Provider Notes (Signed)
[facility] Emergency Department Provider Note   ____________________________________________   First MD Initiated Contact with Patient 02/15/17 1418     (approximate)  I have reviewed the triage vital signs and the nursing notes.   HISTORY  Chief Complaint Psoriasis    HPI Stanley [facility] is a 36 y.o. male patient requesting refill steroidal cream for his psoriasis. This is the patient's fifth visit this year for medication refill for this chronic condition. [facility] not [facility] as directed from each of his previous visits.  Past Medical History:  Diagnosis Date  . Psoriasis     There are no active problems to display for this patient.   History reviewed. No pertinent surgical history.  Prior to Admission medications   Medication Sig Start Date End Date Taking? Authorizing Provider  augmented betamethasone dipropionate (DIPROLENE-AF) 0.05 % ointment Apply topically 2 (two) times daily. 02/15/17   Joni Reining, PA-C  calcipotriene (DOVONOX) 0.005 % cream Apply topically 2 (two) times daily. 11/15/16   Cuthriell, Delorise Royals, PA-C  cyclobenzaprine (FLEXERIL) 5 MG tablet Take 1 tablet (5 mg total) by mouth 3 (three) times daily as needed for muscle spasms. 12/25/16   Little, Traci M, PA-C  triamcinolone ointment (KENALOG) 0.5 % Apply 1 application topically 2 (two) times daily. 11/16/16   Evon Slack, PA-C    Allergies Patient has no known allergies.  No family history on file.  Social History Social History  Substance Use Topics  . Smoking status: Current Some Day Smoker  . Smokeless tobacco: Never Used  . Alcohol use No    Review of Systems  Constitutional: No fever/chills Eyes: No visual changes. ENT: No sore throat. Cardiovascular: Denies chest pain. Respiratory: Denies shortness of breath. Gastrointestinal: No abdominal pain.  No nausea, no vomiting.  No diarrhea.  No  constipation. Genitourinary: Negative for dysuria. Musculoskeletal: Negative for back pain. Skin:Positive for rash. Neurological: Negative for headaches, focal weakness or numbness.  ____________________________________________   PHYSICAL EXAM:  VITAL SIGNS: ED Triage Vitals  Enc Vitals Group     BP 02/15/17 1358 129/77     Pulse Rate 02/15/17 1358 96     Resp 02/15/17 1358 18     Temp 02/15/17 1358 98.3 F (36.8 C)     Temp Source 02/15/17 1358 Oral     SpO2 02/15/17 1358 97 %     Weight 02/15/17 1359 210 lb (95.3 kg)     Height 02/15/17 1359 6\' 2"  (1.88 m)     Head Circumference --      Peak Flow --      Pain Score 02/15/17 1401 0     Pain Loc --      Pain Edu? --      Excl. in GC? --     Constitutional: Alert and oriented. Well appearing and in no acute distress. Cardiovascular: Normal rate, regular rhythm. Grossly normal heart sounds.  Good peripheral circulation. Respiratory: Normal respiratory effort.  No retractions. Lungs CTAB. Neurologic:  Normal speech and language. No gross focal neurologic deficits are appreciated. No gait instability. Skin: Multiple plaques with silver scales.. . Psychiatric: Mood and affect are normal. Speech and behavior are normal.  ____________________________________________   LABS (all labs ordered are listed, but only abnormal results are displayed)  Labs Reviewed - No data to display ____________________________________________  EKG   ____________________________________________  RADIOLOGY  No results found.  ____________________________________________   PROCEDURES  Procedure(s) performed:  None  Procedures  Critical Care performed: No  ____________________________________________   INITIAL IMPRESSION / ASSESSMENT AND PLAN / ED COURSE  Pertinent labs & imaging results that were available during my care of the patient were reviewed by me and considered in my medical decision making (see chart for  details). Medication refill. Psoriasis. Discussed with patient rationale for having this complaint [facility]. Patient given discharge care instructions.     ____________________________________________   FINAL CLINICAL IMPRESSION(S) / ED DIAGNOSES  Final diagnoses:  Encounter for medication refill  Psoriasis (a type of skin inflammation)      NEW MEDICATIONS STARTED DURING THIS VISIT:  Discharge Medication List as of 02/15/2017  2:32 PM       Note:  This document was prepared using Dragon voice recognition software and may include unintentional dictation errors.   Joni ReiningSmith, Ronald K, PA-C 02/15/17 1442  Minna AntisPaduchowski, Kevin, MD 02/15/17 [phone]

## 2017-02-15 NOTE — Discharge Instructions (Signed)
Advised to call and schedule performed with "clinic to establish care for this chronic condition.

## 2017-02-15 NOTE — ED Notes (Signed)
See triage note  States he has hx of psoriasis and has ran out of meds for same  Denies any other sx's

## 2017-02-15 NOTE — ED Triage Notes (Signed)
Psoriasis  approx x2 weeks

## 2017-04-01 ENCOUNTER — Emergency Department
Admission: EM | Admit: 2017-04-01 | Discharge: 2017-04-01 | Disposition: A | Discharge status: Home / Self Care | Payer: Self-pay | Attending: Emergency Medicine | Admitting: Emergency Medicine

## 2017-04-01 ENCOUNTER — Encounter: Payer: Self-pay | Admitting: Emergency Medicine

## 2017-04-01 DIAGNOSIS — F17200 Nicotine dependence, unspecified, uncomplicated: Secondary | ICD-10-CM | POA: Insufficient documentation

## 2017-04-01 DIAGNOSIS — K120 Recurrent oral aphthae: Secondary | ICD-10-CM

## 2017-04-01 DIAGNOSIS — J029 Acute pharyngitis, unspecified: Secondary | ICD-10-CM

## 2017-04-01 DIAGNOSIS — Z79899 Other long term (current) drug therapy: Secondary | ICD-10-CM | POA: Insufficient documentation

## 2017-04-01 MED ORDER — LIDOCAINE VISCOUS 2 % MT SOLN
15.0000 mL | Freq: Once | OROMUCOSAL | Status: AC
  Administered 2017-04-01: 15 mL via OROMUCOSAL
  Filled 2017-04-01: qty 15

## 2017-04-01 MED ORDER — FIRST-DUKES MOUTHWASH MT SUSP: 180.0000 mL | Freq: Four times a day (QID) | OROMUCOSAL | 0 refills | Status: AC | PRN

## 2017-04-01 NOTE — ED Triage Notes (Signed)
Pt c/o sore throat and stuffy nose x 2-3 days. Pt denies fever.

## 2017-04-01 NOTE — ED Provider Notes (Signed)
[facility] Emergency Department Provider Note ____________________________________________  Time seen: 1621  I have reviewed the triage vital signs and the nursing notes.  HISTORY  Chief Complaint  Sore Throat  HPI Stanley Cobb is a 36 y.o. male resistance to the ED with complaints of left-sided sore throat for the last 2-3 days. Patient denies any interim fevers, chills, or sweats. He is current every day smoker and denies any medications in the interim for symptom relief. He denies any sick contacts, recent travel, or other exposures.  Past Medical History:  Diagnosis Date  . Psoriasis     There are no active problems to display for this patient.   History reviewed. No pertinent surgical history.  Prior to Admission medications   Medication Sig Start Date End Date Taking? Authorizing Provider  augmented betamethasone dipropionate (DIPROLENE-AF) 0.05 % ointment Apply topically 2 (two) times daily. 02/15/17   Joni Reining, PA-C  calcipotriene (DOVONOX) 0.005 % cream Apply topically 2 (two) times daily. 11/15/16   Cuthriell, Delorise Royals, PA-C  cyclobenzaprine (FLEXERIL) 5 MG tablet Take 1 tablet (5 mg total) by mouth 3 (three) times daily as needed for muscle spasms. 12/25/16   Little, Traci M, PA-C  Diphenhyd-Hydrocort-Nystatin (FIRST-DUKES MOUTHWASH) SUSP Use as directed 180 mLs in the mouth or throat 4 (four) times daily as needed. 04/01/17 04/08/17  Ryson Bacha, Charlesetta Ivory, PA-C  triamcinolone ointment (KENALOG) 0.5 % Apply 1 application topically 2 (two) times daily. 11/16/16   Evon Slack, PA-C   Allergies Patient has no known allergies.  No family history on file.  Social History Social History  Substance Use Topics  . Smoking status: Current Some Day Smoker  . Smokeless tobacco: Never Used  . Alcohol use No    Review of Systems  Constitutional: Negative for fever. Eyes: Negative for visual changes. ENT: Positive for sore  throat. Cardiovascular: Negative for chest pain. Respiratory: Negative for shortness of breath. Gastrointestinal: Negative for abdominal pain, vomiting and diarrhea. ____________________________________________  PHYSICAL EXAM:  VITAL SIGNS: ED Triage Vitals  Enc Vitals Group     BP 04/01/17 1554 117/76     Pulse Rate 04/01/17 1554 94     Resp 04/01/17 1554 16     Temp 04/01/17 1554 97.8 F (36.6 C)     Temp Source 04/01/17 1554 Oral     SpO2 04/01/17 1554 95 %     Weight 04/01/17 1554 215 lb (97.5 kg)     Height 04/01/17 1554 6\' 2"  (1.88 m)     Head Circumference --      Peak Flow --      Pain Score 04/01/17 1553 8     Pain Loc --      Pain Edu? --      Excl. in GC? --    Constitutional: Alert and oriented. Well appearing and in no distress. Head: Normocephalic and atraumatic. Eyes: Conjunctivae are normal. Normal extraocular movements Ears: Canals clear. TMs intact bilaterally. Nose: No congestion/rhinorrhea/epistaxis. Mouth/Throat: Mucous membranes are moist. Uvula is midline and tonsils are flat. No oropharyngeal lesions are appreciated. Patient localizes his sore throat pain to the left posterior hypopharynx region.  Neck: Supple. No thyromegaly. Hematological/Lymphatic/Immunological: No cervical lymphadenopathy. Cardiovascular: Normal rate, regular rhythm. Normal distal pulses. Respiratory: Normal respiratory effort. No wheezes/rales/rhonchi. ____________________________________________  PROCEDURES  2% viscous lido gargle ____________________________________________  INITIAL IMPRESSION / ASSESSMENT AND PLAN / ED COURSE  Patient with ED evaluation of left-sided sore throat pain which likely represents an  aphthous ulcer. He will be discharged with a prescription for [employer]'s Magic Mouthwash. He will follow-up with a local community clinic for continued symptoms.  ____________________________________________  FINAL CLINICAL IMPRESSION(S) / ED DIAGNOSES  Final  diagnoses:  Sore throat  Aphthae, oral      Karmen Stabs, Charlesetta Ivory, PA-C 04/01/17 1646    Merrily Brittle, MD 04/01/17 2336

## 2017-04-01 NOTE — ED Notes (Signed)

## 2017-04-01 NOTE — Discharge Instructions (Signed)
Your exam is consistent with a probable aphthous ulcer to the throat. Use the prescription mouthwash as directed. Take OTC Tylenol or Motrin as needed. Follow-up with a local community clinic or [facility], as needed.

## 2017-04-01 NOTE — ED Notes (Signed)
See triage note   Presents with sore throat for couple of days  Also having some nasal congestion  Afebrile on arrival

## 2017-06-19 ENCOUNTER — Encounter: Payer: Self-pay | Admitting: Emergency Medicine

## 2017-06-19 ENCOUNTER — Other Ambulatory Visit: Payer: Self-pay

## 2017-06-19 ENCOUNTER — Emergency Department
Admission: EM | Admit: 2017-06-19 | Discharge: 2017-06-19 | Disposition: A | Discharge status: Home / Self Care | Payer: No Typology Code available for payment source | Attending: Emergency Medicine | Admitting: Emergency Medicine

## 2017-06-19 ENCOUNTER — Emergency Department: Payer: No Typology Code available for payment source

## 2017-06-19 DIAGNOSIS — Y939 Activity, unspecified: Secondary | ICD-10-CM | POA: Diagnosis not present

## 2017-06-19 DIAGNOSIS — Y92410 Unspecified street and highway as the place of occurrence of the external cause: Secondary | ICD-10-CM | POA: Insufficient documentation

## 2017-06-19 DIAGNOSIS — F17210 Nicotine dependence, cigarettes, uncomplicated: Secondary | ICD-10-CM | POA: Insufficient documentation

## 2017-06-19 DIAGNOSIS — G44319 Acute post-traumatic headache, not intractable: Secondary | ICD-10-CM | POA: Insufficient documentation

## 2017-06-19 DIAGNOSIS — Z79899 Other long term (current) drug therapy: Secondary | ICD-10-CM | POA: Diagnosis not present

## 2017-06-19 DIAGNOSIS — Y999 Unspecified external cause status: Secondary | ICD-10-CM | POA: Insufficient documentation

## 2017-06-19 DIAGNOSIS — S161XXA Strain of muscle, fascia and tendon at neck level, initial encounter: Secondary | ICD-10-CM | POA: Diagnosis not present

## 2017-06-19 DIAGNOSIS — S8002XA Contusion of left knee, initial encounter: Secondary | ICD-10-CM | POA: Diagnosis not present

## 2017-06-19 MED ORDER — METHOCARBAMOL 500 MG PO TABS: 500.0000 mg | ORAL_TABLET | Freq: Four times a day (QID) | ORAL | 0 refills | Status: DC

## 2017-06-19 MED ORDER — MELOXICAM 15 MG PO TABS: 15.0000 mg | ORAL_TABLET | Freq: Every day | ORAL | 0 refills | Status: DC
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
## 2017-10-16 ENCOUNTER — Emergency Department: Payer: Self-pay

## 2017-10-16 ENCOUNTER — Emergency Department
Admission: EM | Admit: 2017-10-16 | Discharge: 2017-10-16 | Disposition: A | Discharge status: Home / Self Care | Payer: Self-pay | Attending: Emergency Medicine | Admitting: Emergency Medicine

## 2017-10-16 ENCOUNTER — Encounter: Payer: Self-pay | Admitting: Emergency Medicine

## 2017-10-16 DIAGNOSIS — R1013 Epigastric pain: Secondary | ICD-10-CM | POA: Insufficient documentation

## 2017-10-16 DIAGNOSIS — F17200 Nicotine dependence, unspecified, uncomplicated: Secondary | ICD-10-CM | POA: Insufficient documentation

## 2017-10-16 DIAGNOSIS — Z79899 Other long term (current) drug therapy: Secondary | ICD-10-CM | POA: Insufficient documentation

## 2017-10-16 LAB — COMPREHENSIVE METABOLIC PANEL
ALT: 49 U/L (ref 17–63)
ANION GAP: 6 (ref 5–15)
AST: 38 U/L (ref 15–41)
Albumin: 4.4 g/dL (ref 3.5–5.0)
Alkaline Phosphatase: 51 U/L (ref 38–126)
BILIRUBIN TOTAL: 0.9 mg/dL (ref 0.3–1.2)
BUN: 11 mg/dL (ref 6–20)
CO2: 27 mmol/L (ref 22–32)
Calcium: 9.4 mg/dL (ref 8.9–10.3)
Chloride: 105 mmol/L (ref 101–111)
Creatinine, Ser: 0.96 mg/dL (ref 0.61–1.24)
Glucose, Bld: 110 mg/dL — ABNORMAL HIGH (ref 65–99)
POTASSIUM: 3.9 mmol/L (ref 3.5–5.1)
Sodium: 138 mmol/L (ref 135–145)
TOTAL PROTEIN: 7.5 g/dL (ref 6.5–8.1)

## 2017-10-16 LAB — CBC
HEMATOCRIT: 46.5 % (ref 40.0–52.0)
Hemoglobin: 15.9 g/dL (ref 13.0–18.0)
MCH: 30 pg (ref 26.0–34.0)
MCHC: 34.2 g/dL (ref 32.0–36.0)
MCV: 87.7 fL (ref 80.0–100.0)
Platelets: 244 10*3/uL (ref 150–440)
RBC: 5.3 MIL/uL (ref 4.40–5.90)
RDW: 12.9 % (ref 11.5–14.5)
WBC: 5.7 10*3/uL (ref 3.8–10.6)

## 2017-10-16 LAB — LIPASE, BLOOD: Lipase: 30 U/L (ref 11–51)

## 2017-10-16 MED ORDER — OMEPRAZOLE 40 MG PO CPDR: 40.0000 mg | DELAYED_RELEASE_CAPSULE | Freq: Every day | ORAL | 1 refills | Status: AC

## 2017-10-16 MED ORDER — GI COCKTAIL ~~LOC~~
30.0000 mL | Freq: Once | ORAL | Status: AC
Start: 2017-10-16 — End: 2017-10-16
  Administered 2017-10-16: 30 mL via ORAL
  Filled 2017-10-16: qty 30

## 2017-10-16 NOTE — ED Provider Notes (Signed)
[facility] Emergency Department Provider Note  ____________________________________________   First MD Initiated Contact with Patient 10/16/17 1402     (approximate)  I have reviewed the triage vital signs and the nursing notes.   HISTORY  Chief Complaint Abdominal Pain    HPI Stanley Cobb is a 37 y.o. male presents emergency department complaining of epigastric pain for several days.  He states he does not feel like his food is digesting and pushing through.  However he is having normal bowel movements.  He did have some vomiting, the last time was last night.  He is a heavy drinker.  He states he is not had any beer in the last 2 days.  He said difficulty with his abdomen before.  He was told he had an ulcer.  He states that the beginning of the week he did have a dark stool but has not seen any since then.  He denies any fever or chills.  He denies any diarrhea.  He denies any chest pain or shortness of breath  Past Medical History:  Diagnosis Date  . Psoriasis     There are no active problems to display for this patient.   History reviewed. No pertinent surgical history.  Prior to Admission medications   Medication Sig Start Date End Date Taking? Authorizing Provider  augmented betamethasone dipropionate (DIPROLENE-AF) 0.05 % ointment Apply topically 2 (two) times daily. 02/15/17   Sable Feil, PA-C  calcipotriene (DOVONOX) 0.005 % cream Apply topically 2 (two) times daily. 11/15/16   Cuthriell, Charline Bills, PA-C  omeprazole (PRILOSEC) 40 MG capsule Take 1 capsule (40 mg total) by mouth daily. 10/16/17   Versie Starks, PA-C    Allergies Patient has no known allergies.  History reviewed. No pertinent family history.  Social History Social History   Tobacco Use  . Smoking status: Current Some Day Smoker  . Smokeless tobacco: Never Used  Substance Use Topics  . Alcohol use: Yes  . Drug use: No    Review of  Systems  Constitutional: No fever/chills Eyes: No visual changes. ENT: No sore throat. Respiratory: Denies cough Gastrointestinal: Positive for abdominal pain and a few episodes of vomiting Genitourinary: Negative for dysuria. Musculoskeletal: Negative for back pain. Skin: Negative for rash.    ____________________________________________   PHYSICAL EXAM:  VITAL SIGNS: ED Triage Vitals  Enc Vitals Group     BP 10/16/17 1328 136/90     Pulse Rate 10/16/17 1328 77     Resp 10/16/17 1328 18     Temp 10/16/17 1328 97.7 F (36.5 C)     Temp Source 10/16/17 1328 Oral     SpO2 10/16/17 1328 98 %     Weight 10/16/17 1329 215 lb (97.5 kg)     Height 10/16/17 1329 _0  (1.88 m)     Head Circumference --      Peak Flow --      Pain Score 10/16/17 1329 8     Pain Loc --      Pain Edu? --      Excl. in [place]? --     Constitutional: Alert and oriented. Well appearing and in no acute distress. Eyes: Conjunctivae are normal.  Head: Atraumatic. Nose: No congestion/rhinnorhea. Mouth/Throat: Mucous membranes are moist.   Cardiovascular: Normal rate, regular rhythm.  Heart sounds are normal Respiratory: Normal respiratory effort.  No retractions, lungs are clear to auscultation Abdomen: Soft tender in the epigastric area, rectal exam shows a  negative Hemoccult GU: deferred Musculoskeletal: FROM all extremities, warm and well perfused Neurologic:  Normal speech and language.  Skin:  Skin is warm, dry and intact. No rash noted. Psychiatric: Mood and affect are normal. Speech and behavior are normal.  ____________________________________________   LABS (all labs ordered are listed, but only abnormal results are displayed)  Labs Reviewed  COMPREHENSIVE METABOLIC PANEL - Abnormal; Notable for the following components:      Result Value   Glucose, Bld 110 (*)    All other components within normal limits  LIPASE, BLOOD  CBC  URINALYSIS, COMPLETE (UACMP) WITH MICROSCOPIC    ____________________________________________   ____________________________________________  RADIOLOGY  X-ray of the abdomen is negative for any acute abnormality  ____________________________________________   PROCEDURES  Procedure(s) performed: GI cocktail  Procedures    ____________________________________________   INITIAL IMPRESSION / ASSESSMENT AND PLAN / ED COURSE  Pertinent labs & imaging results that were available during my care of the patient were reviewed by me and considered in my medical decision making (see chart for details).  Patient is a 37 year old male complaining of epigastric pain.  He has had some vomiting.  He last vomited yesterday.  History of heavy alcohol use.  He was told a few years ago that he had an ulcer.  States feeling is about the same  On physical exam he appears well and nontoxic.  The CBC is normal, met C is normal, and the lipase is normal.  Guaiac test is negative.  X-ray of the abdomen is negative.  Patient was given a GI cocktail which did give him relief  She was given a prescription for omeprazole 40 mg daily.  He is to follow-up at [facility].  He says he gets "charity care "when he goes there.  His wife alreaedy called to make an appointment while sitting in the exam room.  The patient is being discharged with strict instructions to avoid alcohol.  He is to take medication as prescribed.  He is to follow-up with the specialist at [facility].  He states he understands will comply with our instructions.  He is to return to the emergency department if worsening.  He was discharged in stable condition     As part of my medical decision making, I reviewed the following data within the [place] notes reviewed and incorporated, Labs reviewed CBC normal, met C normal, lipase normal, Radiograph reviewed three-way abdomen is normal, Notes from prior ED visits and [place] Controlled Substance  Database  ____________________________________________   FINAL CLINICAL IMPRESSION(S) / ED DIAGNOSES  Final diagnoses:  Acute epigastric pain      NEW MEDICATIONS STARTED DURING THIS VISIT:  Discharge Medication List as of 10/16/2017  3:44 PM    START taking these medications   Details  omeprazole (PRILOSEC) 40 MG capsule Take 1 capsule (40 mg total) by mouth daily., Starting Tue 10/16/2017, Print         Note:  This document was prepared using Dragon voice recognition software and may include unintentional dictation errors.    Versie Starks, PA-C 10/16/17 1646    Earleen Newport, MD 10/21/17 0700

## 2017-10-16 NOTE — ED Notes (Signed)
See triage note  Presents with epigastric pain for several days  Denies any fever  But has had some vomiting  Last time vomited was last pm  States pain is mainly after eating  Has been using zantac w/o relief

## 2017-10-16 NOTE — Discharge Instructions (Signed)
Follow-up with the clinics at [facility].  You need to see a gastric doctor.  You need to be evaluated for ulcers and H. pylori.  Take the medication as prescribed.  Do not drink alcohol.  Return to emergency department if your abdominal pain is worsening

## 2017-10-16 NOTE — ED Triage Notes (Signed)
Pt to  ED with c/o abdominal pain that started a few days ago. Pt states pain increases after eating. Pt denies N/V/D within the last 24 hrs.

## 2017-12-20 ENCOUNTER — Emergency Department
Admission: EM | Admit: 2017-12-20 | Discharge: 2017-12-20 | Disposition: A | Discharge status: Home / Self Care | Payer: Self-pay | Attending: Student in an Organized Health Care Education/Training Program | Admitting: Student in an Organized Health Care Education/Training Program

## 2017-12-20 ENCOUNTER — Encounter: Payer: Self-pay | Admitting: Emergency Medicine

## 2017-12-20 ENCOUNTER — Other Ambulatory Visit: Payer: Self-pay

## 2017-12-20 ENCOUNTER — Emergency Department: Payer: Self-pay

## 2017-12-20 DIAGNOSIS — Y92410 Unspecified street and highway as the place of occurrence of the external cause: Secondary | ICD-10-CM | POA: Insufficient documentation

## 2017-12-20 DIAGNOSIS — F17200 Nicotine dependence, unspecified, uncomplicated: Secondary | ICD-10-CM | POA: Insufficient documentation

## 2017-12-20 DIAGNOSIS — Y9389 Activity, other specified: Secondary | ICD-10-CM | POA: Insufficient documentation

## 2017-12-20 DIAGNOSIS — Y999 Unspecified external cause status: Secondary | ICD-10-CM | POA: Insufficient documentation

## 2017-12-20 DIAGNOSIS — Z79899 Other long term (current) drug therapy: Secondary | ICD-10-CM | POA: Insufficient documentation

## 2017-12-20 DIAGNOSIS — M545 Low back pain: Secondary | ICD-10-CM | POA: Insufficient documentation

## 2017-12-20 MED ORDER — CYCLOBENZAPRINE HCL 5 MG PO TABS: 5.0000 mg | ORAL_TABLET | Freq: Three times a day (TID) | ORAL | 0 refills | Status: AC | PRN

## 2017-12-20 MED ORDER — MELOXICAM 15 MG PO TABS: 15.0000 mg | ORAL_TABLET | Freq: Every day | ORAL | 1 refills | Status: AC

## 2017-12-20 NOTE — ED Notes (Signed)
Pt discharged to home.  Family member driving.  Discharge instructions reviewed.  Verbalized understanding.  No questions or concerns at this time.  Teach back verified.  Pt in NAD.  No items left in ED.   
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
## 2018-10-17 ENCOUNTER — Emergency Department
Admission: EM | Admit: 2018-10-17 | Discharge: 2018-10-17 | Disposition: A | Discharge status: Home / Self Care | Payer: Self-pay | Attending: Emergency Medicine | Admitting: Emergency Medicine

## 2018-10-17 ENCOUNTER — Emergency Department: Payer: Self-pay

## 2018-10-17 ENCOUNTER — Encounter: Payer: Self-pay | Admitting: *Deleted

## 2018-10-17 ENCOUNTER — Other Ambulatory Visit: Payer: Self-pay

## 2018-10-17 DIAGNOSIS — J40 Bronchitis, not specified as acute or chronic: Secondary | ICD-10-CM | POA: Insufficient documentation

## 2018-10-17 DIAGNOSIS — Z79899 Other long term (current) drug therapy: Secondary | ICD-10-CM | POA: Insufficient documentation

## 2018-10-17 DIAGNOSIS — F17200 Nicotine dependence, unspecified, uncomplicated: Secondary | ICD-10-CM | POA: Insufficient documentation

## 2018-10-17 DIAGNOSIS — Z209 Contact with and (suspected) exposure to unspecified communicable disease: Secondary | ICD-10-CM | POA: Insufficient documentation

## 2018-10-17 LAB — CBC
HCT: 48.2 % (ref 39.0–52.0)
Hemoglobin: 16.3 g/dL (ref 13.0–17.0)
MCH: 29.8 pg (ref 26.0–34.0)
MCHC: 33.8 g/dL (ref 30.0–36.0)
MCV: 88.1 fL (ref 80.0–100.0)
Platelets: 287 K/uL (ref 150–400)
RBC: 5.47 MIL/uL (ref 4.22–5.81)
RDW: 12.6 % (ref 11.5–15.5)
WBC: 8.6 K/uL (ref 4.0–10.5)
nRBC: 0 % (ref 0.0–0.2)

## 2018-10-17 LAB — BASIC METABOLIC PANEL WITH GFR
Anion gap: 10 (ref 5–15)
BUN: 14 mg/dL (ref 6–20)
CO2: 25 mmol/L (ref 22–32)
Calcium: 9.2 mg/dL (ref 8.9–10.3)
Chloride: 104 mmol/L (ref 98–111)
Creatinine, Ser: 1.12 mg/dL (ref 0.61–1.24)
GFR calc Af Amer: >60 mL/min
GFR calc non Af Amer: >60 mL/min
Glucose, Bld: 99 mg/dL (ref 70–99)
Potassium: 3.7 mmol/L (ref 3.5–5.1)
Sodium: 139 mmol/L (ref 135–145)

## 2018-10-17 LAB — TROPONIN I: Troponin I: <0.03 ng/mL (ref <0.03)

## 2018-10-17 MED ORDER — PREDNISONE 20 MG PO TABS: 60.0000 mg | ORAL_TABLET | Freq: Every day | ORAL | 0 refills | Status: AC

## 2018-10-17 MED ORDER — PREDNISONE 20 MG PO TABS
60.0000 mg | ORAL_TABLET | Freq: Once | ORAL | Status: AC
  Administered 2018-10-17: 60 mg via ORAL
  Filled 2018-10-17: qty 3

## 2018-10-17 MED ORDER — SODIUM CHLORIDE 0.9% FLUSH: 3.0000 mL | Freq: Once | INTRAVENOUS | Status: DC

## 2018-10-17 MED ORDER — IPRATROPIUM-ALBUTEROL 0.5-2.5 (3) MG/3ML IN SOLN
3.0000 mL | Freq: Once | RESPIRATORY_TRACT | Status: AC
  Administered 2018-10-17: 3 mL via RESPIRATORY_TRACT
  Filled 2018-10-17: qty 3

## 2018-10-17 MED ORDER — ALBUTEROL SULFATE HFA 108 (90 BASE) MCG/ACT IN AERS: 2.0000 | INHALATION_SPRAY | Freq: Four times a day (QID) | RESPIRATORY_TRACT | 2 refills | Status: DC | PRN

## 2018-10-17 NOTE — ED Provider Notes (Signed)
[facility] Emergency Department Provider Note  ____________________________________________  Time seen: Approximately 11:19 PM  I have reviewed the triage vital signs and the nursing notes.   HISTORY  Chief Complaint Cough and Chest Pain   HPI Stanley Cobb is a 38 y.o. male with h/o psoriasis who presents for evaluation of cough.  Patient reports a week of cough productive of yellow sputum, shortness of breath and tightness in his chest.  His symptoms are mild and constant.  Reports a several family members have had flulike symptoms.  Has had some nausea, few episodes of nonbloody nonbilious emesis and diarrhea.  No fever but has had chills.  Patient is a smoker.  Denies any drug use.  No history of asthma or COPD.  Past Medical History:  Diagnosis Date  . Psoriasis      Prior to Admission medications   Medication Sig Start Date End Date Taking? Authorizing Provider  albuterol (PROVENTIL HFA;VENTOLIN HFA) 108 (90 Base) MCG/ACT inhaler Inhale 2 puffs into the lungs every 6 (six) hours as needed for wheezing or shortness of breath. 10/17/18   Nita Sickle, MD  augmented betamethasone dipropionate (DIPROLENE-AF) 0.05 % ointment Apply topically 2 (two) times daily. 02/15/17   Joni Reining, PA-C  calcipotriene (DOVONOX) 0.005 % cream Apply topically 2 (two) times daily. 11/15/16   Cuthriell, Delorise Royals, PA-C  omeprazole (PRILOSEC) 40 MG capsule Take 1 capsule (40 mg total) by mouth daily. 10/16/17   Fisher, Roselyn Bering, PA-C  predniSONE (DELTASONE) 20 MG tablet Take 3 tablets (60 mg total) by mouth daily for 4 days. 10/17/18 10/21/18  Nita Sickle, MD    Allergies Patient has no known allergies.  No family history on file.  Social History Social History   Tobacco Use  . Smoking status: Current Some Day Smoker  . Smokeless tobacco: Never Used  Substance Use Topics  . Alcohol use: Yes  . Drug use: No    Review of Systems  Constitutional:  Negative for fever. Eyes: Negative for visual changes. ENT: Negative for sore throat. Neck: No neck pain  Cardiovascular: + chest tightness Respiratory: + shortness of breath, cough Gastrointestinal: Negative for abdominal pain. + vomiting and diarrhea. Genitourinary: Negative for dysuria. Musculoskeletal: Negative for back pain. Skin: Negative for rash. Neurological: Negative for headaches, weakness or numbness. Psych: No SI or HI  ____________________________________________   PHYSICAL EXAM:  VITAL SIGNS: ED Triage Vitals  Enc Vitals Group     BP 10/17/18 1842 (!) 141/90     Pulse Rate 10/17/18 1842 90     Resp 10/17/18 1842 18     Temp 10/17/18 1842 98.1 F (36.7 C)     Temp Source 10/17/18 1842 Oral     SpO2 10/17/18 1842 97 %     Weight 10/17/18 1842 225 lb (102.1 kg)     Height 10/17/18 1842 6\' 2"  (1.88 m)     Head Circumference --      Peak Flow --      Pain Score 10/17/18 1855 8     Pain Loc --      Pain Edu? --      Excl. in GC? --     Constitutional: Alert and oriented. Well appearing and in no apparent distress. HEENT:      Head: Normocephalic and atraumatic.         Eyes: Conjunctivae are normal. Sclera is non-icteric.       Mouth/Throat: Mucous membranes are moist.  Neck: Supple with no signs of meningismus. Cardiovascular: Regular rate and rhythm. No murmurs, gallops, or rubs. 2+ symmetrical distal pulses are present in all extremities. No JVD. Respiratory: Normal respiratory effort. Lungs are clear to auscultation bilaterally. No wheezes, crackles, or rhonchi.  Gastrointestinal: Soft, non tender, and non distended with positive bowel sounds. No rebound or guarding. Musculoskeletal: Nontender with normal range of motion in all extremities. No edema, cyanosis, or erythema of extremities. Neurologic: Normal speech and language. Face is symmetric. Moving all extremities. No gross focal neurologic deficits are appreciated. Skin: Skin is warm, dry and  intact. No rash noted. Psychiatric: Mood and affect are normal. Speech and behavior are normal.  ____________________________________________   LABS (all labs ordered are listed, but only abnormal results are displayed)  Labs Reviewed  BASIC METABOLIC PANEL  CBC  TROPONIN I   ____________________________________________  EKG  ED ECG REPORT I, Nita Sickle, the attending physician, personally viewed and interpreted this ECG.  Normal sinus rhythm, rate of 87, normal intervals, normal axis, no ST elevations or depressions.  Normal EKG. ____________________________________________  RADIOLOGY  I have personally reviewed the images performed during this visit and I agree with the Radiologist's read.   Interpretation by Radiologist:  Dg Chest 2 View  Result Date: 10/17/2018 CLINICAL DATA:  Productive cough, shortness of breath, and mid chest pain for 1 week. Nausea. Occasional smoker. EXAM: CHEST - 2 VIEW COMPARISON:  10/16/2017 FINDINGS: The heart size and mediastinal contours are within normal limits. Both lungs are clear. The visualized skeletal structures are unremarkable. IMPRESSION: No active cardiopulmonary disease. Electronically Signed   By: Burman Nieves M.D.   On: 10/17/2018 19:34     ____________________________________________   PROCEDURES  Procedure(s) performed: None Procedures Critical Care performed:  None ____________________________________________   INITIAL IMPRESSION / ASSESSMENT AND PLAN / ED COURSE   38 y.o. male with h/o psoriasis who presents for evaluation of productive cough, chest tightness, vomiting, diarrhea, and chills times a week.  Several family members with similar symptoms.  Presentation concerning for flu versus viral illness versus bronchitis.  Chest x-ray negative for pneumonia.  Labs are within normal limits.  Troponin x1 is negative.  With symptoms constant for a week no indication for second troponin.  EKG showing no ischemic  changes.  Patient was given DuoNeb and prednisone and was discharged home on albuterol and prednisone course.  Recommended follow-up with primary care doctor.  Discussed and return precautions.      As part of my medical decision making, I reviewed the following data within the electronic MEDICAL RECORD NUMBER Nursing notes reviewed and incorporated, Labs reviewed , EKG interpreted , Old EKG reviewed, Old chart reviewed, Radiograph reviewed  Notes from prior ED visits and [place] Controlled Substance Database    Pertinent labs & imaging results that were available during my care of the patient were reviewed by me and considered in my medical decision making (see chart for details).    ____________________________________________   FINAL CLINICAL IMPRESSION(S) / ED DIAGNOSES  Final diagnoses:  Bronchitis      NEW MEDICATIONS STARTED DURING THIS VISIT:  ED Discharge Orders         Ordered    albuterol (PROVENTIL HFA;VENTOLIN HFA) 108 (90 Base) MCG/ACT inhaler  Every 6 hours PRN     10/17/18 2318    predniSONE (DELTASONE) 20 MG tablet  Daily     10/17/18 2318           Note:  This document was  prepared using Conservation officer, historic buildings and may include unintentional dictation errors.    Don Perking, [place], MD 10/17/18 [phone]

## 2018-10-17 NOTE — ED Notes (Signed)
HA, body aches, N/V/D, CP, SOB, and states that his family recently had flu.

## 2018-10-17 NOTE — ED Triage Notes (Signed)
Pt has a cough, sob, and chest pain for 1 week.  cig smoker.  Pt reports nausea.  Pt alert   Speech clear.

## 2018-10-17 NOTE — ED Notes (Signed)
Flu swab sent to lab

## 2018-10-17 NOTE — ED Notes (Signed)
Sent green and purple tubes to lab. 

## 2018-10-17 NOTE — ED Notes (Signed)
Pt educated about prednisone and finally agreed to take med. Requesting cough meds. Educated again about purpose of prednisone.

## 2019-06-02 ENCOUNTER — Emergency Department: Payer: Self-pay

## 2019-06-02 ENCOUNTER — Emergency Department
Admission: EM | Admit: 2019-06-02 | Discharge: 2019-06-02 | Disposition: A | Discharge status: Home / Self Care | Payer: Self-pay | Attending: Student in an Organized Health Care Education/Training Program | Admitting: Student in an Organized Health Care Education/Training Program

## 2019-06-02 ENCOUNTER — Encounter: Payer: Self-pay | Admitting: Emergency Medicine

## 2019-06-02 ENCOUNTER — Other Ambulatory Visit: Payer: Self-pay

## 2019-06-02 DIAGNOSIS — F17200 Nicotine dependence, unspecified, uncomplicated: Secondary | ICD-10-CM | POA: Insufficient documentation

## 2019-06-02 DIAGNOSIS — Z79899 Other long term (current) drug therapy: Secondary | ICD-10-CM | POA: Insufficient documentation

## 2019-06-02 DIAGNOSIS — J029 Acute pharyngitis, unspecified: Secondary | ICD-10-CM | POA: Insufficient documentation

## 2019-06-02 LAB — COMPREHENSIVE METABOLIC PANEL
ALT: 40 U/L (ref 0–44)
AST: 39 U/L (ref 15–41)
Albumin: 3.8 g/dL (ref 3.5–5.0)
Alkaline Phosphatase: 57 U/L (ref 38–126)
Anion gap: 11 (ref 5–15)
BUN: 11 mg/dL (ref 6–20)
CO2: 23 mmol/L (ref 22–32)
Calcium: 9 mg/dL (ref 8.9–10.3)
Chloride: 104 mmol/L (ref 98–111)
Creatinine, Ser: 1.02 mg/dL (ref 0.61–1.24)
GFR calc Af Amer: >60 mL/min (ref >60)
GFR calc non Af Amer: >60 mL/min (ref >60)
Glucose, Bld: 88 mg/dL (ref 70–99)
Potassium: 3.6 mmol/L (ref 3.5–5.1)
Sodium: 138 mmol/L (ref 135–145)
Total Bilirubin: 0.6 mg/dL (ref 0.3–1.2)
Total Protein: 6.7 g/dL (ref 6.5–8.1)

## 2019-06-02 LAB — CBC WITH DIFFERENTIAL/PLATELET
Abs Immature Granulocytes: 0.03 10*3/uL (ref 0.00–0.07)
Basophils Absolute: 0 10*3/uL (ref 0.0–0.1)
Basophils Relative: 0 %
Eosinophils Absolute: 0.1 10*3/uL (ref 0.0–0.5)
Eosinophils Relative: 2 %
HCT: 44.2 % (ref 39.0–52.0)
Hemoglobin: 14.9 g/dL (ref 13.0–17.0)
Immature Granulocytes: 0 %
Lymphocytes Relative: 30 %
Lymphs Abs: 2.5 10*3/uL (ref 0.7–4.0)
MCH: 29.2 pg (ref 26.0–34.0)
MCHC: 33.7 g/dL (ref 30.0–36.0)
MCV: 86.7 fL (ref 80.0–100.0)
Monocytes Absolute: 0.7 10*3/uL (ref 0.1–1.0)
Monocytes Relative: 8 %
Neutro Abs: 4.8 10*3/uL (ref 1.7–7.7)
Neutrophils Relative %: 60 %
Platelets: 266 10*3/uL (ref 150–400)
RBC: 5.1 MIL/uL (ref 4.22–5.81)
RDW: 12.6 % (ref 11.5–15.5)
WBC: 8.1 10*3/uL (ref 4.0–10.5)
nRBC: 0 % (ref 0.0–0.2)

## 2019-06-02 LAB — LACTIC ACID, PLASMA: Lactic Acid, Venous: 1.1 mmol/L (ref 0.5–1.9)

## 2019-06-02 LAB — GROUP A STREP BY PCR: Group A Strep by PCR: NOT DETECTED

## 2019-06-02 MED ORDER — DEXAMETHASONE SODIUM PHOSPHATE 10 MG/ML IJ SOLN
10.0000 mg | Freq: Once | INTRAMUSCULAR | Status: AC
  Administered 2019-06-02: 10 mg via INTRAVENOUS
  Filled 2019-06-02: qty 1

## 2019-06-02 MED ORDER — IOHEXOL 300 MG/ML  SOLN
75.0000 mL | Freq: Once | INTRAMUSCULAR | Status: AC | PRN
  Administered 2019-06-02: 75 mL via INTRAVENOUS
  Filled 2019-06-02: qty 75

## 2019-06-02 MED ORDER — ONDANSETRON HCL 4 MG/2ML IJ SOLN
4.0000 mg | Freq: Once | INTRAMUSCULAR | Status: AC
  Administered 2019-06-02: 4 mg via INTRAVENOUS
  Filled 2019-06-02: qty 2

## 2019-06-02 MED ORDER — AMOXICILLIN 875 MG PO TABS: 875.0000 mg | ORAL_TABLET | Freq: Two times a day (BID) | ORAL | 0 refills | Status: DC

## 2019-06-02 MED ORDER — MORPHINE SULFATE (PF) 4 MG/ML IV SOLN
4.0000 mg | Freq: Once | INTRAVENOUS | Status: AC
  Administered 2019-06-02: 4 mg via INTRAVENOUS
  Filled 2019-06-02: qty 1

## 2019-06-02 MED ORDER — SODIUM CHLORIDE 0.9 % IV BOLUS
1000.0000 mL | Freq: Once | INTRAVENOUS | Status: AC
  Administered 2019-06-02: 1000 mL via INTRAVENOUS

## 2019-06-02 MED ORDER — MAGIC MOUTHWASH W/LIDOCAINE: 5.0000 mL | Freq: Four times a day (QID) | ORAL | 0 refills | Status: DC

## 2019-06-02 MED ORDER — SODIUM CHLORIDE 0.9 % IV SOLN
3.0000 g | Freq: Once | INTRAVENOUS | Status: AC
  Administered 2019-06-02: 3 g via INTRAVENOUS
  Filled 2019-06-02: qty 8

## 2019-06-02 NOTE — ED Provider Notes (Signed)
[facility] Emergency Department Provider Note  ____________________________________________  Time seen: Approximately 9:28 PM  I have reviewed the triage vital signs and the nursing notes.   HISTORY  Chief Complaint Sore Throat    HPI Stanley Cobb is a 38 y.o. male who presents the emergency department complaining of right-sided sore throat, voice changes.  Patient reports 2 to 3 days of worsening sore throat.  Patient states that the pain is all located on the right side.  He denies any fevers or chills, nasal congestion, cough, shortness of breath, domino pain, nausea vomiting.  Patient reports that the pain radiates from the right side of his throat along his neck and into his right ear region.         Past Medical History:  Diagnosis Date  . Psoriasis     There are no active problems to display for this patient.   History reviewed. No pertinent surgical history.  Prior to Admission medications   Medication Sig Start Date End Date Taking? Authorizing Provider  albuterol (PROVENTIL HFA;VENTOLIN HFA) 108 (90 Base) MCG/ACT inhaler Inhale 2 puffs into the lungs every 6 (six) hours as needed for wheezing or shortness of breath. 10/17/18   Nita SickleVeronese, [place], MD  amoxicillin (AMOXIL) 875 MG tablet Take 1 tablet (875 mg total) by mouth 2 (two) times daily. 06/02/19   Novalee Horsfall, Delorise RoyalsJonathan D, PA-C  augmented betamethasone dipropionate (DIPROLENE-AF) 0.05 % ointment Apply topically 2 (two) times daily. 02/15/17   Joni ReiningSmith, Ronald K, PA-C  calcipotriene (DOVONOX) 0.005 % cream Apply topically 2 (two) times daily. 11/15/16   Jung Yurchak, Delorise RoyalsJonathan D, PA-C  magic mouthwash w/lidocaine SOLN Take 5 mLs by mouth 4 (four) times daily. 06/02/19   Adalei Novell, Delorise RoyalsJonathan D, PA-C  omeprazole (PRILOSEC) 40 MG capsule Take 1 capsule (40 mg total) by mouth daily. 10/16/17   Faythe GheeFisher, Susan W, PA-C    Allergies Patient has no known allergies.  History reviewed. No pertinent family  history.  Social History Social History   Tobacco Use  . Smoking status: Current Some Day Smoker  . Smokeless tobacco: Never Used  Substance Use Topics  . Alcohol use: Yes  . Drug use: No     Review of Systems  Constitutional: No fever/chills Eyes: No visual changes. No discharge ENT: Right-sided sore throat, voice changes Cardiovascular: no chest pain. Respiratory: no cough. No SOB. Gastrointestinal: No abdominal pain.  No nausea, no vomiting.  No diarrhea.  No constipation. Musculoskeletal: Negative for musculoskeletal pain. Skin: Negative for rash, abrasions, lacerations, ecchymosis. Neurological: Negative for headaches, focal weakness or numbness. 10-point ROS otherwise negative.  ____________________________________________   PHYSICAL EXAM:  VITAL SIGNS: ED Triage Vitals [06/02/19 2041]  Enc Vitals Group     BP (!) 144/89     Pulse Rate 78     Resp 17     Temp 99.6 F (37.6 C)     Temp Source Oral     SpO2 99 %     Weight      Height      Head Circumference      Peak Flow      Pain Score      Pain Loc      Pain Edu?      Excl. in GC?      Constitutional: Alert and oriented. Well appearing and in no acute distress. Eyes: Conjunctivae are normal. PERRL. EOMI. Head: Atraumatic. ENT:      Ears:       Nose: No  congestion/rhinnorhea.      Mouth/Throat: Mucous membranes are moist.  Tonsils are erythematous and edematous bilaterally, worse edema on the right when compared to left.  Mild uvular deviation.  No other oropharyngeal erythema or edema. Neck: No stridor.  Neck is supple full range of motion.  Patient is tender to palpation in the right anterolateral aspect of the neck overlying right tonsillar region.  No appreciable mass on exam. Hematological/Lymphatic/Immunilogical: Scattered bilateral anterior nontender cervical lymphadenopathy. Cardiovascular: Normal rate, regular rhythm. Normal S1 and S2.  Good peripheral circulation. Respiratory: Normal  respiratory effort without tachypnea or retractions. Lungs CTAB. Good air entry to the bases with no decreased or absent breath sounds. Musculoskeletal: Full range of motion to all extremities. No gross deformities appreciated. Neurologic:  Normal speech and language. No gross focal neurologic deficits are appreciated.  Skin:  Skin is warm, dry and intact. No rash noted. Psychiatric: Mood and affect are normal. Speech and behavior are normal. Patient exhibits appropriate insight and judgement.   ____________________________________________   LABS (all labs ordered are listed, but only abnormal results are displayed)  Labs Reviewed  GROUP A STREP BY PCR  COMPREHENSIVE METABOLIC PANEL  LACTIC ACID, PLASMA  CBC WITH DIFFERENTIAL/PLATELET   ____________________________________________  EKG   ____________________________________________  RADIOLOGY I personally viewed and evaluated these images as part of my medical decision making, as well as reviewing the written report by the radiologist.  Ct Soft Tissue Neck W Contrast  Result Date: 06/02/2019 CLINICAL DATA:  Right-sided sore throat with voice changes. EXAM: CT NECK WITH CONTRAST TECHNIQUE: Multidetector CT imaging of the neck was performed using the standard protocol following the bolus administration of intravenous contrast. CONTRAST:  17mL OMNIPAQUE IOHEXOL 300 MG/ML  SOLN COMPARISON:  None. FINDINGS: PHARYNX AND LARYNX: --Nasopharynx: Fossae of Rosenmuller are clear. Normal adenoid tonsils for age. --Oral cavity and oropharynx: The palatine and lingual tonsils are normal. The visible oral cavity and floor of mouth are normal. --Hypopharynx: Normal vallecula and pyriform sinuses. --Larynx: Normal epiglottis and pre-epiglottic space. Normal aryepiglottic and vocal folds. --Retropharyngeal space: No abscess, effusion or lymphadenopathy. SALIVARY GLANDS: --Parotid: No mass lesion or inflammation. No sialolithiasis or ductal dilatation.  --Submandibular: Symmetric without inflammation. No sialolithiasis or ductal dilatation. --Sublingual: Normal. No ranula or other visible lesion of the base of tongue and floor of mouth. THYROID: Normal. LYMPH NODES: No enlarged or abnormal density lymph nodes. VASCULAR: Major cervical vessels are patent. LIMITED INTRACRANIAL: Normal. VISUALIZED ORBITS: Normal. MASTOIDS AND VISUALIZED PARANASAL SINUSES: No fluid levels or advanced mucosal thickening. No mastoid effusion. SKELETON: No bony spinal canal stenosis. No lytic or blastic lesions. Reversal of normal cervical lordosis. UPPER CHEST: Clear. OTHER: None. IMPRESSION: No acute soft tissue abnormality of the neck. Electronically Signed   By: Deatra Robinson M.D.   On: 06/02/2019 23:14    ____________________________________________    PROCEDURES  Procedure(s) performed:    Procedures    Medications  sodium chloride 0.9 % bolus 1,000 mL (1,000 mLs Intravenous New Bag/Given 06/02/19 2150)  morphine 4 MG/ML injection 4 mg (4 mg Intravenous Given 06/02/19 2152)  ondansetron (ZOFRAN) injection 4 mg (4 mg Intravenous Given 06/02/19 2151)  Ampicillin-Sulbactam (UNASYN) 3 g in sodium chloride 0.9 % 100 mL IVPB (0 g Intravenous Stopped 06/02/19 2304)  dexamethasone (DECADRON) injection 10 mg (10 mg Intravenous Given 06/02/19 2156)  iohexol (OMNIPAQUE) 300 MG/ML solution 75 mL (75 mLs Intravenous Contrast Given 06/02/19 2255)     ____________________________________________   INITIAL IMPRESSION / ASSESSMENT AND PLAN /  ED COURSE  Pertinent labs & imaging results that were available during my care of the patient were reviewed by me and considered in my medical decision making (see chart for details).  Review of the [place] CSRS was performed in accordance of the [place] prior to dispensing any controlled drugs.           Patient's diagnosis is consistent with pharyngitis.  Patient presents emergency department complaining of right-sided sore  throat, voice changes.  Patient had an equal tonsillar hypertrophy with mild uvular deviation.  Patient also had voice changes.  Given these findings patient was evaluated with CT scan, labs and given Unasyn and Decadron here in the emergency department.  No evidence of peritonsillar abscess.  Patient will be placed on antibiotics even though he had a negative strep test.  Patient is also given Magic mouthwash for symptom relief.  Follow-up primary care as needed..  Patient is given ED precautions to return to the ED for any worsening or new symptoms.     ____________________________________________  FINAL CLINICAL IMPRESSION(S) / ED DIAGNOSES  Final diagnoses:  Pharyngitis, unspecified etiology      NEW MEDICATIONS STARTED DURING THIS VISIT:  ED Discharge Orders         Ordered    amoxicillin (AMOXIL) 875 MG tablet  2 times daily     06/02/19 2322    magic mouthwash w/lidocaine SOLN  4 times daily    Note to Pharmacy: Dispense in a 1/1/1 ratio. Use lidocaine, diphenhydramine, prednisolone   06/02/19 2322              This chart was dictated using voice recognition software/Dragon. Despite best efforts to proofread, errors can occur which can change the meaning. Any change was purely unintentional.    Darletta Moll, PA-C 06/02/19 2324    Nena Polio, MD 06/02/19 [phone]

## 2019-06-02 NOTE — ED Triage Notes (Signed)
Pt c/o right sided throat pain as well as swelling. Pt denies fever. Pt sts, pain radiates into head, neck, and into teeth. Swelling and redness noted.

## 2019-10-25 ENCOUNTER — Other Ambulatory Visit: Payer: Self-pay

## 2019-10-25 ENCOUNTER — Emergency Department
Admission: EM | Admit: 2019-10-25 | Discharge: 2019-10-25 | Disposition: A | Discharge status: Home / Self Care | Payer: Self-pay | Attending: Emergency Medicine | Admitting: Emergency Medicine

## 2019-10-25 ENCOUNTER — Encounter: Payer: Self-pay | Admitting: Emergency Medicine

## 2019-10-25 DIAGNOSIS — R112 Nausea with vomiting, unspecified: Secondary | ICD-10-CM | POA: Insufficient documentation

## 2019-10-25 DIAGNOSIS — F17200 Nicotine dependence, unspecified, uncomplicated: Secondary | ICD-10-CM | POA: Insufficient documentation

## 2019-10-25 DIAGNOSIS — R140 Abdominal distension (gaseous): Secondary | ICD-10-CM | POA: Insufficient documentation

## 2019-10-25 HISTORY — DX: Gastric ulcer, unspecified as acute or chronic, without hemorrhage or perforation: K25.9

## 2019-10-25 MED ORDER — FAMOTIDINE 20 MG PO TABS
20.0000 mg | ORAL_TABLET | Freq: Once | ORAL | Status: AC
  Administered 2019-10-25: 23:00:00 20 mg via ORAL
  Filled 2019-10-25: qty 1

## 2019-10-25 MED ORDER — PANTOPRAZOLE SODIUM 40 MG IV SOLR: 40.0000 mg | Freq: Once | INTRAVENOUS | Status: DC

## 2019-10-25 MED ORDER — FAMOTIDINE IN NACL 20-0.9 MG/50ML-% IV SOLN: 20.0000 mg | Freq: Once | INTRAVENOUS | Status: DC

## 2019-10-25 MED ORDER — SUCRALFATE 1 G PO TABS: 1.0000 g | ORAL_TABLET | Freq: Two times a day (BID) | ORAL | 0 refills | Status: AC

## 2019-10-25 MED ORDER — SODIUM CHLORIDE 0.9 % IV BOLUS: 1000.0000 mL | Freq: Once | INTRAVENOUS | Status: DC

## 2019-10-25 MED ORDER — ALUM & MAG HYDROXIDE-SIMETH 200-200-20 MG/5ML PO SUSP
30.0000 mL | Freq: Once | ORAL | Status: AC
  Administered 2019-10-25: 30 mL via ORAL
  Filled 2019-10-25: qty 30

## 2019-10-25 MED ORDER — ESOMEPRAZOLE MAGNESIUM 40 MG PO CPDR: 40.0000 mg | DELAYED_RELEASE_CAPSULE | Freq: Every day | ORAL | 0 refills | Status: AC

## 2019-10-25 MED ORDER — ONDANSETRON HCL 4 MG/2ML IJ SOLN: 4.0000 mg | Freq: Once | INTRAMUSCULAR | Status: DC

## 2019-10-25 NOTE — ED Notes (Addendum)
Pt asked about lab results. This nurse called the lab who stated that they did not have his samples. The triage nurse stated that she did remember drawing it but did not know what happened to it after that. The patient was very angry when informed that the blood needed to be redrawn and demanded to be discharged. Dr Silverio Lay notified and prepared discharge paperwork and prescriptions. Pt also declined discharge vitals.

## 2019-10-25 NOTE — ED Triage Notes (Signed)
Patient with complaint of epigastric pain, nausea and diarrhea that started yesterday. Patient states that he thinks it has something to do with his ulcer.

## 2019-10-25 NOTE — ED Provider Notes (Addendum)
[facility] EMERGENCY DEPARTMENT Provider Note   CSN: [id number] Arrival date & time: 10/25/19  2019     History Chief Complaint  Patient presents with  . Abdominal Pain    Stanley Cobb is a 39 y.o. 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  Patient states that he has a history of gastric ulcer but never had endoscopy before.  Patient had an episode of vomiting yesterday.  He also had another episode around 8 today.  However he states that he just feels hungry and has been eating a lot since then.  He noticed some epigastric pain as well.  He states that he did drink alcohol last week but none recently.  Denies any binge drinking.  States that during his previous visits, he got some GI cocktail and felt better.  The history is provided by the patient.       Past Medical History:  Diagnosis Date  . Gastric ulcer   . Psoriasis     There are no problems to display for this patient.   History reviewed. No pertinent surgical history.     No family history on file.  Social History   Tobacco Use  . Smoking status: Current Some Day Smoker  . Smokeless tobacco: Never Used  Substance Use Topics  . Alcohol use: Yes    Comment: occ  . Drug use: No    Home Medications Prior to Admission medications   Medication Sig Start Date End Date Taking? Authorizing Provider  albuterol (PROVENTIL HFA;VENTOLIN HFA) 108 (90 Base) MCG/ACT inhaler Inhale 2 puffs into the lungs every 6 (six) hours as needed for wheezing or shortness of breath. 10/17/18   Rudene Re, MD  amoxicillin (AMOXIL) 875 MG tablet Take 1 tablet (875 mg total) by mouth 2 (two) times daily. 06/02/19   Cuthriell, Charline Bills, PA-C  augmented betamethasone dipropionate (DIPROLENE-AF) 0.05 % ointment Apply topically 2 (two) times daily. 02/15/17   Sable Feil, PA-C  calcipotriene (DOVONOX) 0.005 % cream Apply topically 2 (two) times daily. 11/15/16    Cuthriell, Charline Bills, PA-C  esomeprazole (NEXIUM) 40 MG capsule Take 1 capsule (40 mg total) by mouth daily. 10/25/19 10/24/20  Drenda Freeze, MD  magic mouthwash w/lidocaine SOLN Take 5 mLs by mouth 4 (four) times daily. 06/02/19   Cuthriell, Charline Bills, PA-C  omeprazole (PRILOSEC) 40 MG capsule Take 1 capsule (40 mg total) by mouth daily. 10/16/17   Fisher, Linden Dolin, PA-C  sucralfate (CARAFATE) 1 g tablet Take 1 tablet (1 g total) by mouth 2 (two) times daily. 10/25/19 10/24/20  Drenda Freeze, MD    Allergies    Patient has no known allergies.  Review of Systems   Review of Systems  Gastrointestinal: Positive for abdominal pain and vomiting.  All other systems reviewed and are negative.   Physical Exam Updated Vital Signs BP (!) 147/99   Pulse 81   Temp 98.2 F (36.8 C) (Oral)   Resp 18   Ht 6\' 2"  (1.88 m)   Wt 104.8 kg   SpO2 96%   BMI 29.66 kg/m   Physical Exam Vitals and nursing note reviewed.  Constitutional:      Appearance: He is well-developed.  HENT:     Head: Normocephalic.     Mouth/Throat:     Mouth: Mucous membranes are moist.  Eyes:     Extraocular Movements: Extraocular movements intact.  Cardiovascular:     Rate and  Rhythm: Normal rate and regular rhythm.  Pulmonary:     Effort: Pulmonary effort is normal.     Breath sounds: Normal breath sounds.  Abdominal:     General: Abdomen is flat.     Comments: Mild epigastric tenderness, no guarding no RUQ tenderness   Skin:    General: Skin is warm.     Capillary Refill: Capillary refill takes less than 2 seconds.  Neurological:     General: No focal deficit present.     Mental Status: He is alert and oriented to person, place, and time.  Psychiatric:        Mood and Affect: Mood normal.        Behavior: Behavior normal.     ED Results / Procedures / Treatments   Labs (all labs ordered are listed, but only abnormal results are displayed) Labs Reviewed  LIPASE, BLOOD  COMPREHENSIVE  METABOLIC PANEL  CBC  URINALYSIS, COMPLETE (UACMP) WITH MICROSCOPIC    EKG EKG Interpretation  Date/Time:  Saturday October 25 2019 20:47:10 EDT Ventricular Rate:  83 PR Interval:  184 QRS Duration: 84 QT Interval:  368 QTC Calculation: 432 R Axis:   29 Text Interpretation: Normal sinus rhythm Minimal voltage criteria for LVH, may be normal variant ( R in aVL ) Borderline ECG When compared with ECG of 17-Oct-2018 18:46, No significant change was found Confirmed by Richardean Canal [phone]) on 10/25/2019 10:13:02 PM   Radiology No results found.  Procedures Procedures (including critical care time)  Medications Ordered in ED Medications  alum & mag hydroxide-simeth (MAALOX/MYLANTA) 200-200-20 MG/5ML suspension 30 mL (30 mLs Oral Given 10/25/19 2316)  famotidine (PEPCID) tablet 20 mg (20 mg Oral Given 10/25/19 2315)    ED Course  I have reviewed the triage vital signs and the nursing notes.  Pertinent labs & imaging results that were available during my care of the patient were reviewed by me and considered in my medical decision making (see chart for details).    MDM Rules/Calculators/A&P                      Stanley Cobb is a 38 y.o. male who presented with epigastric pain.  He had some vomiting yesterday but tolerated p.o. today.  He states that he has a history of gastric ulcer but never had endoscopy before. He was actually only diagnosed with alcohol gastritis.  Will get LFTs and lipase.  Will order GI cocktail and Pepcid as well.  11:15 pm Labs pending. Anticipate dc home with GI follow up and PPIs. Signed out to Dr. Manson Passey in the ED.   11:31 PM Patient wants to go home but labs not resulted. Refused to get stuck again. Will have him follow up with GI.   Final Clinical Impression(s) / ED Diagnoses Final diagnoses:  Non-intractable vomiting with nausea, unspecified vomiting type    Rx / DC Orders ED Discharge Orders         Ordered    esomeprazole (NEXIUM) 40 MG  capsule  Daily     10/25/19 2319    sucralfate (CARAFATE) 1 g tablet  2 times daily     10/25/19 2319           Charlynne Pander, MD 10/25/19 2318    Charlynne Pander, MD 10/25/19 [phone]

## 2019-10-25 NOTE — Discharge Instructions (Addendum)
Take nexium daily   Take carafate as prescribed   See GI for follow up   Return to ER if you have worse abdominal pain, vomiting

## 2020-07-24 ENCOUNTER — Emergency Department
Admission: EM | Admit: 2020-07-24 | Discharge: 2020-07-25 | Disposition: A | Discharge status: Home / Self Care | Payer: Self-pay | Attending: Emergency Medicine | Admitting: Emergency Medicine

## 2020-07-24 ENCOUNTER — Encounter: Payer: Self-pay | Admitting: Emergency Medicine

## 2020-07-24 DIAGNOSIS — K047 Periapical abscess without sinus: Secondary | ICD-10-CM | POA: Insufficient documentation

## 2020-07-24 DIAGNOSIS — F17200 Nicotine dependence, unspecified, uncomplicated: Secondary | ICD-10-CM | POA: Insufficient documentation

## 2020-07-24 LAB — COMPREHENSIVE METABOLIC PANEL
ALT: 53 U/L — ABNORMAL HIGH (ref 0–44)
AST: 53 U/L — ABNORMAL HIGH (ref 15–41)
Albumin: 3.9 g/dL (ref 3.5–5.0)
Alkaline Phosphatase: 73 U/L (ref 38–126)
Anion gap: 8 (ref 5–15)
BUN: 15 mg/dL (ref 6–20)
CO2: 25 mmol/L (ref 22–32)
Calcium: 8.8 mg/dL — ABNORMAL LOW (ref 8.9–10.3)
Chloride: 104 mmol/L (ref 98–111)
Creatinine, Ser: 1.17 mg/dL (ref 0.61–1.24)
GFR, Estimated: >60 mL/min (ref >60)
Glucose, Bld: 100 mg/dL — ABNORMAL HIGH (ref 70–99)
Potassium: 3.9 mmol/L (ref 3.5–5.1)
Sodium: 137 mmol/L (ref 135–145)
Total Bilirubin: 0.9 mg/dL (ref 0.3–1.2)
Total Protein: 6.9 g/dL (ref 6.5–8.1)

## 2020-07-24 LAB — CBC WITH DIFFERENTIAL/PLATELET
Abs Immature Granulocytes: 0.02 10*3/uL (ref 0.00–0.07)
Basophils Absolute: 0 10*3/uL (ref 0.0–0.1)
Basophils Relative: 1 %
Eosinophils Absolute: 0.1 10*3/uL (ref 0.0–0.5)
Eosinophils Relative: 1 %
HCT: 46.9 % (ref 39.0–52.0)
Hemoglobin: 16.1 g/dL (ref 13.0–17.0)
Immature Granulocytes: 0 %
Lymphocytes Relative: 39 %
Lymphs Abs: 2.3 10*3/uL (ref 0.7–4.0)
MCH: 30.7 pg (ref 26.0–34.0)
MCHC: 34.3 g/dL (ref 30.0–36.0)
MCV: 89.5 fL (ref 80.0–100.0)
Monocytes Absolute: 0.6 10*3/uL (ref 0.1–1.0)
Monocytes Relative: 11 %
Neutro Abs: 2.9 10*3/uL (ref 1.7–7.7)
Neutrophils Relative %: 48 %
Platelets: 305 10*3/uL (ref 150–400)
RBC: 5.24 MIL/uL (ref 4.22–5.81)
RDW: 12.3 % (ref 11.5–15.5)
WBC: 5.9 10*3/uL (ref 4.0–10.5)
nRBC: 0 % (ref 0.0–0.2)

## 2020-07-24 LAB — LACTIC ACID, PLASMA: Lactic Acid, Venous: 1.2 mmol/L (ref 0.5–1.9)

## 2020-07-24 MED ORDER — LIDOCAINE HCL (PF) 1 % IJ SOLN
INTRAMUSCULAR | Status: AC
  Administered 2020-07-24: 5 mL
  Filled 2020-07-24: qty 5

## 2020-07-24 MED ORDER — LIDOCAINE VISCOUS HCL 2 % MT SOLN
15.0000 mL | Freq: Once | OROMUCOSAL | Status: DC
  Filled 2020-07-24: qty 15

## 2020-07-24 MED ORDER — IBUPROFEN 600 MG PO TABS
600.0000 mg | ORAL_TABLET | Freq: Once | ORAL | Status: AC
  Administered 2020-07-24: 600 mg via ORAL
  Filled 2020-07-24: qty 1

## 2020-07-24 MED ORDER — ONDANSETRON 4 MG PO TBDP
4.0000 mg | ORAL_TABLET | Freq: Once | ORAL | Status: AC
  Administered 2020-07-24: 4 mg via ORAL
  Filled 2020-07-24: qty 1

## 2020-07-24 MED ORDER — AMOXICILLIN 500 MG PO CAPS: 500.0000 mg | ORAL_CAPSULE | Freq: Three times a day (TID) | ORAL | 0 refills | Status: DC

## 2020-07-24 MED ORDER — CEFTRIAXONE SODIUM 1 G IJ SOLR
1.0000 g | Freq: Once | INTRAMUSCULAR | Status: AC
  Administered 2020-07-24: 1 g via INTRAMUSCULAR
  Filled 2020-07-24: qty 10

## 2020-07-24 MED ORDER — LIDOCAINE HCL (PF) 1 % IJ SOLN: 5.0000 mL | Freq: Once | INTRAMUSCULAR | Status: AC

## 2020-07-24 NOTE — ED Provider Notes (Signed)
[facility] Emergency Department Provider Note  ____________________________________________  Time seen: Approximately 11:16 PM  I have reviewed the triage vital signs and the nursing notes.   HISTORY  Chief Complaint Oral Swelling    HPI Stanley Cobb is a 39 y.o. male that presents to the emergency department for evaluation of a dental abscess.  Patient states that he has had left bottom dental abscess for about 1 week and area started draining 2 days ago.  Ever since the abscess drained, he states that he can still taste the taste of the drainage in his mouth and feels that the drainage is still sitting in his stomach and it causes him to vomit.  He thinks he needs antibiotics for the infection.  No fevers.   Past Medical History:  Diagnosis Date  . Gastric ulcer   . Psoriasis     There are no problems to display for this patient.   History reviewed. No pertinent surgical history.  Prior to Admission medications   Medication Sig Start Date End Date Taking? Authorizing Provider  albuterol (PROVENTIL HFA;VENTOLIN HFA) 108 (90 Base) MCG/ACT inhaler Inhale 2 puffs into the lungs every 6 (six) hours as needed for wheezing or shortness of breath. 10/17/18   Nita Sickle, MD  amoxicillin (AMOXIL) 500 MG capsule Take 1 capsule (500 mg total) by mouth 3 (three) times daily. 07/24/20   Enid Derry, PA-C  augmented betamethasone dipropionate (DIPROLENE-AF) 0.05 % ointment Apply topically 2 (two) times daily. 02/15/17   Joni Reining, PA-C  calcipotriene (DOVONOX) 0.005 % cream Apply topically 2 (two) times daily. 11/15/16   Cuthriell, Delorise Royals, PA-C  esomeprazole (NEXIUM) 40 MG capsule Take 1 capsule (40 mg total) by mouth daily. 10/25/19 10/24/20  Charlynne Pander, MD  magic mouthwash w/lidocaine SOLN Take 5 mLs by mouth 4 (four) times daily. 06/02/19   Cuthriell, Delorise Royals, PA-C  omeprazole (PRILOSEC) 40 MG capsule Take 1 capsule (40 mg total) by  mouth daily. 10/16/17   Fisher, Roselyn Bering, PA-C  sucralfate (CARAFATE) 1 g tablet Take 1 tablet (1 g total) by mouth 2 (two) times daily. 10/25/19 10/24/20  Charlynne Pander, MD    Allergies Patient has no known allergies.  History reviewed. No pertinent family history.  Social History Social History   Tobacco Use  . Smoking status: Current Some Day Smoker  . Smokeless tobacco: Never Used  Substance Use Topics  . Alcohol use: Yes    Comment: occ  . Drug use: No     Review of Systems  Cardiovascular: No chest pain. Respiratory: No cough. No SOB. Gastrointestinal: No abdominal pain.  No nausea, no vomiting.  Musculoskeletal: Negative for musculoskeletal pain. Skin: Negative for rash, abrasions, lacerations, ecchymosis. Neurological: Negative for headaches   ____________________________________________   PHYSICAL EXAM:  VITAL SIGNS: ED Triage Vitals [07/24/20 2005]  Enc Vitals Group     BP 134/87     Pulse Rate 84     Resp 18     Temp 99 F (37.2 C)     Temp Source Oral     SpO2 98 %     Weight      Height      Head Circumference      Peak Flow      Pain Score      Pain Loc      Pain Edu?      Excl. in GC?      Constitutional: Alert and oriented. Well  appearing and in no acute distress. Eyes: Conjunctivae are normal. PERRL. EOMI. Head: Atraumatic. ENT:      Ears:      Nose: No congestion/rhinnorhea.      Mouth/Throat: Mucous membranes are moist.  Oropharynx nonerythematous.  Tonsils not enlarged.  Uvula midline.  Full range of motion of jaw.  No oral swelling. Neck: No stridor.  No cervical lymphadenopathy. Cardiovascular: Normal rate, regular rhythm.  Good peripheral circulation. Respiratory: Normal respiratory effort without tachypnea or retractions. Lungs CTAB. Good air entry to the bases with no decreased or absent breath sounds. Gastrointestinal: Bowel sounds 4 quadrants. Soft and nontender to palpation. No guarding or rigidity. No palpable masses.  No distention.  Musculoskeletal: Full range of motion to all extremities. No gross deformities appreciated. Neurologic:  Normal speech and language. No gross focal neurologic deficits are appreciated.  Skin:  Skin is warm, dry and intact. No rash noted. Psychiatric: Mood and affect are normal. Speech and behavior are normal. Patient exhibits appropriate insight and judgement.   ____________________________________________   LABS (all labs ordered are listed, but only abnormal results are displayed)  Labs Reviewed  COMPREHENSIVE METABOLIC PANEL - Abnormal; Notable for the following components:      Result Value   Glucose, Bld 100 (*)    Calcium 8.8 (*)    AST 53 (*)    ALT 53 (*)    All other components within normal limits  LACTIC ACID, PLASMA  CBC WITH DIFFERENTIAL/PLATELET   ____________________________________________  EKG   ____________________________________________  RADIOLOGY   No results found.  ____________________________________________    PROCEDURES  Procedure(s) performed:    Procedures    Medications  ondansetron (ZOFRAN-ODT) disintegrating tablet 4 mg (has no administration in time range)  lidocaine (XYLOCAINE) 2 % viscous mouth solution 15 mL (has no administration in time range)  ibuprofen (ADVIL) tablet 600 mg (has no administration in time range)  cefTRIAXone (ROCEPHIN) injection 1 g (has no administration in time range)     ____________________________________________   INITIAL IMPRESSION / ASSESSMENT AND PLAN / ED COURSE  Pertinent labs & imaging results that were available during my care of the patient were reviewed by me and considered in my medical decision making (see chart for details).  Review of the [place] CSRS was performed in accordance of the [facility] prior to dispensing any controlled drugs.     Patient's diagnosis is consistent with dental abscess.  Vital signs and exam are reassuring.  Lab work is largely unremarkable.   Patient is tolerating oral intake.  He was given a dose of IM Rocephin for infection.  Patient will be discharged home with prescriptions for amoxicillin. Patient is to follow up with dentist as directed.  Dental resources were given.  Patient is given ED precautions to return to the ED for any worsening or new symptoms.  [name] was evaluated in Emergency Department on 07/24/2020 for the symptoms described in the history of present illness. He was evaluated in the context of the global COVID-19 pandemic, which necessitated consideration that the patient might be at risk for infection with the SARS-CoV-2 virus that causes COVID-19. Institutional protocols and algorithms that pertain to the evaluation of patients at risk for COVID-19 are in a state of rapid change based on information released by regulatory bodies including the CDC and federal and state organizations. These policies and algorithms were followed during the patient's care in the ED.   ____________________________________________  FINAL CLINICAL IMPRESSION(S) / ED DIAGNOSES  Final diagnoses:  Dental infection      NEW MEDICATIONS STARTED DURING THIS VISIT:  ED Discharge Orders         Ordered    amoxicillin (AMOXIL) 500 MG capsule  3 times daily        07/24/20 2314              This chart was dictated using voice recognition software/Dragon. Despite best efforts to proofread, errors can occur which can change the meaning. Any change was purely unintentional.    Enid Derry, PA-C 07/25/20 0020    Minna Antis, MD 07/25/20 2032

## 2020-07-24 NOTE — Discharge Instructions (Signed)
OPTIONS FOR DENTAL FOLLOW UP CARE ° °[place] Department of Health and Human Services - Local Safety Net Dental Clinics °http://www.ncdhhs.gov/dph/oralhealth/services/safetynetclinics.htm °  °[facility] (336-562-3123) ° °[facility] ([phone]) ° °[facility] ([phone] ext [phone]) ° °[facility] ([phone]) ° °[facility] ([phone]) °This clinic caters to the indigent population and is on a lottery system. °Location: °[facility], [address], [place] °Clinic Hours: °Wednesdays from 6pm - 9pm, patients seen by a lottery system. °For dates, call or go to [email or web address].[facility].edu/[facility]/patients/Dental-[facility] °Services: °Cleanings, fillings and simple extractions. °Payment Options: °DENTAL WORK IS FREE OF CHARGE. Bring proof of income or support. °Best way to get seen: °Arrive at 5:15 pm - this is a lottery, NOT first come/first serve, so arriving earlier will not increase your chances of being seen. °  °  °[facility] °[phone] °Select option 1 for emergencies °  °Location: °[facility], [address], [place] °Clinic Hours: °No walk-ins accepted - call the day before to schedule an appointment. °Check in times are 9:30 am and 1:30 pm. °Services: °Simple extractions, temporary fillings, pulpectomy/pulp debridement, uncomplicated abscess drainage. °Payment Options: °PAYMENT IS DUE AT THE TIME OF SERVICE.  Fee is usually $100-200, additional surgical procedures (e.g. abscess drainage) may be extra. °Cash, checks, Visa/MasterCard accepted.  Can file Medicaid if patient is covered for dental - patient should call case worker to check. °No discount for [facility] patients. °Best way to get seen: °MUST call the day before and get onto the schedule. Can usually be seen the next 1-2 days. No walk-ins accepted. °  °  °[facility] °[phone] °   °Location: °[facility], [address], [place] °Clinic Hours: °M, W, Th, F 8am or 1:30pm, Tues 9a or 1:30 - first come/first served. °Services: °Simple extractions, temporary fillings, uncomplicated abscess drainage.  You do not need to be an [place] resident. °Payment Options: °PAYMENT IS DUE AT THE TIME OF SERVICE. °Dental insurance, otherwise sliding scale - bring proof of income or support. °Depending on income and treatment needed, cost is usually $50-200. °Best way to get seen: °Arrive early as it is first come/first served. °  °  °[facility] °[phone] °  °Location: °[address] °Clinic Hours: °Mon-Thu 8a-5p °Services: °Most basic dental services including extractions and fillings. °Payment Options: °PAYMENT IS DUE AT THE TIME OF SERVICE. °Sliding scale, up to 50% off - bring proof if income or support. °Medicaid with dental option accepted. °Best way to get seen: °Call to schedule an appointment, can usually be seen within 2 weeks OR they will try to see walk-ins - show up at 8a or 2p (you may have to wait). °  °  °[facility] °[phone] °[place] RESIDENTS ONLY °  °Location: °[facility], [address], [place], [place] [address] °Clinic Hours: By appointment only. °Monday - Thursday 8am-5pm, Friday 8am-12pm °Services: Cleanings, fillings, extractions. °Payment Options: °PAYMENT IS DUE AT THE TIME OF SERVICE. °Cash, Visa or MasterCard. Sliding scale - $30 minimum per service. °Best way to get seen: °Come in to office, complete packet and make an appointment - need proof of income °or support monies for each household member and proof of [place] residence. °Usually takes about a month to get in. °  °  °[facility] °[phone] °  °Location: °[address]   [place] °Clinic Hours: Walk-in Urgent Care Dental Services are offered Monday-Friday  mornings only. °The numbers of emergencies accepted daily is limited to the number of °providers available. °Maximum 15 - Mondays, Wednesdays & Thursdays °Maximum 10 - Tuesdays & Fridays °Services: °You do not need to be a [place] resident to be seen for a dental emergency. °Emergencies are defined as pain, swelling, abnormal bleeding, or dental trauma. Walkins will receive x-rays if needed. °NOTE: Dental cleaning is not an emergency. °Payment Options: °PAYMENT IS DUE AT THE TIME OF SERVICE. °Minimum co-pay is $40.00 for uninsured patients. °Minimum co-pay is $3.00 for Medicaid with dental coverage. °Dental Insurance is accepted and must be presented at time of visit. °Medicare does not cover dental. °Forms of payment: Cash, credit card, checks. °Best way to get seen: °If not previously registered with the clinic, walk-in dental registration begins at 7:15 am and is on a first come/first serve basis. °If previously registered with the clinic, call to make an appointment. °  °  °[facility] °[phone] °[place] RESIDENTS ONLY °  °Location: °[address], [place], [place] °Clinic Hours: °Mon-Thu 10a-2p °Services: Extractions only! °Payment Options: °FREE (donations accepted) - bring proof of income or support °Best way to get seen: °Call and schedule an appointment OR come at 8am on the 1st Monday of every month (except for holidays) when it is first come/first served. °  °  °[facility] °[phone] °  °Location: °[address], [place] °Clinic Hours: °Friday mornings °Services, Payment Options, Best way to get seen: °Call for info °

## 2020-07-24 NOTE — ED Triage Notes (Signed)
Pt reports dental abscess to the bottom right x1 week and area drained x2 days ago and since has had sore throat, N/V. Pt denies fever.No swelling noted at the site of abscess.

## 2020-07-24 NOTE — ED Notes (Signed)
Patient ambulated independently to room. Able to speak in full sentences. NAD noted. Call bell within reach.

## 2020-10-27 ENCOUNTER — Encounter: Payer: Self-pay | Admitting: Emergency Medicine

## 2020-10-27 ENCOUNTER — Emergency Department
Admission: EM | Admit: 2020-10-27 | Discharge: 2020-10-27 | Disposition: A | Discharge status: Home / Self Care | Payer: Self-pay | Attending: Emergency Medicine | Admitting: Emergency Medicine

## 2020-10-27 ENCOUNTER — Other Ambulatory Visit: Payer: Self-pay

## 2020-10-27 DIAGNOSIS — F17200 Nicotine dependence, unspecified, uncomplicated: Secondary | ICD-10-CM | POA: Insufficient documentation

## 2020-10-27 DIAGNOSIS — K0889 Other specified disorders of teeth and supporting structures: Secondary | ICD-10-CM | POA: Insufficient documentation

## 2020-10-27 MED ORDER — AMOXICILLIN 875 MG PO TABS: 875.0000 mg | ORAL_TABLET | Freq: Two times a day (BID) | ORAL | 0 refills | Status: DC

## 2020-10-27 NOTE — Discharge Instructions (Signed)
OPTIONS FOR DENTAL FOLLOW UP CARE ° °[place] Department of Health and Human Services - Local Safety Net Dental Clinics °http://www.ncdhhs.gov/dph/oralhealth/services/safetynetclinics.htm °  °[facility] (336-562-3123) ° °[facility] [place] (919-933-9087) ° °[facility] (919-663-1744 ext [phone]) ° °[facility] ([phone]) ° °[facility] ([phone]) °This clinic caters to the indigent population and is on a lottery system. °Location: °[facility], [address], [place] °Clinic Hours: °Wednesdays from 6pm - 9pm, patients seen by a lottery system. °For dates, call or go to [email or web address]/[facility]/patients/[email or web address] °Services: °Cleanings, fillings and simple extractions. °Payment Options: °DENTAL WORK IS FREE OF CHARGE. Bring proof of income or support. °Best way to get seen: °Arrive at 5:15 pm - this is a lottery, NOT first come/first serve, so arriving earlier will not increase your chances of being seen. °  °  °[facility] °[phone] °Select option 1 for emergencies °  °Location: °[facility], [address], [place] °Clinic Hours: °No walk-ins accepted - call the day before to schedule an appointment. °Check in times are 9:30 am and 1:30 pm. °Services: °Simple extractions, temporary fillings, pulpectomy/pulp debridement, uncomplicated abscess drainage. °Payment Options: °PAYMENT IS DUE AT THE TIME OF SERVICE.  Fee is usually $100-200, additional surgical procedures (e.g. abscess drainage) may be extra. °Cash, checks, Visa/MasterCard accepted.  Can file Medicaid if patient is covered for dental - patient should call case worker to check. °No discount for [facility] patients. °Best way to get seen: °MUST call the day before and get onto the schedule. Can usually be seen the next 1-2 days. No walk-ins accepted. °  °  °[facility] °[phone] °   °Location: °[facility], [address], [place] °Clinic Hours: °M, W, Th, F 8am or 1:30pm, Tues 9a or 1:30 - first come/first served. °Services: °Simple extractions, temporary fillings, uncomplicated abscess drainage.  You do not need to be an [place] resident. °Payment Options: °PAYMENT IS DUE AT THE TIME OF SERVICE. °Dental insurance, otherwise sliding scale - bring proof of income or support. °Depending on income and treatment needed, cost is usually $50-200. °Best way to get seen: °Arrive early as it is first come/first served. °  °  °[facility] °[phone] °  °Location: °[address] °Clinic Hours: °Mon-Thu 8a-5p °Services: °Most basic dental services including extractions and fillings. °Payment Options: °PAYMENT IS DUE AT THE TIME OF SERVICE. °Sliding scale, up to 50% off - bring proof if income or support. °Medicaid with dental option accepted. °Best way to get seen: °Call to schedule an appointment, can usually be seen within 2 weeks OR they will try to see walk-ins - show up at 8a or 2p (you may have to wait). °  °  °[facility] °[phone] °[place] RESIDENTS ONLY °  °Location: °[facility], [address], [place], [place] [address] °Clinic Hours: By appointment only. °Monday - Thursday 8am-5pm, Friday 8am-12pm °Services: Cleanings, fillings, extractions. °Payment Options: °PAYMENT IS DUE AT THE TIME OF SERVICE. °Cash, Visa or MasterCard. Sliding scale - $30 minimum per service. °Best way to get seen: °Come in to office, complete packet and make an appointment - need proof of income °or support monies for each household member and proof of [place] residence. °Usually takes about a month to get in. °  °  °[facility] °[phone] °  °Location: °[address]   [place] °Clinic Hours: Walk-in Urgent Care Dental Services are offered Monday-Friday  mornings only. °The numbers of emergencies accepted daily is limited to the number of °providers available. °Maximum 15 - Mondays, Wednesdays & Thursdays °Maximum 10 - Tuesdays & Fridays °Services: °You do not need to be a [place] resident to be seen for a dental emergency. °Emergencies are defined as pain, swelling, abnormal bleeding, or dental trauma. Walkins will receive x-rays if needed. °NOTE: Dental cleaning is not an emergency. °Payment Options: °PAYMENT IS DUE AT THE TIME OF SERVICE. °Minimum co-pay is $40.00 for uninsured patients. °Minimum co-pay is $3.00 for Medicaid with dental coverage. °Dental Insurance is accepted and must be presented at time of visit. °Medicare does not cover dental. °Forms of payment: Cash, credit card, checks. °Best way to get seen: °If not previously registered with the clinic, walk-in dental registration begins at 7:15 am and is on a first come/first serve basis. °If previously registered with the clinic, call to make an appointment. °  °  °[facility] °[phone] °[place] RESIDENTS ONLY °  °Location: °[address], [place], [place] °Clinic Hours: °Mon-Thu 10a-2p °Services: Extractions only! °Payment Options: °FREE (donations accepted) - bring proof of income or support °Best way to get seen: °Call and schedule an appointment OR come at 8am on the 1st Monday of every month (except for holidays) when it is first come/first served. °  °  °[facility] °[phone] °  °Location: °[address], [place] °Clinic Hours: °Friday mornings °Services, Payment Options, Best way to get seen: °Call for info °

## 2020-10-27 NOTE — ED Notes (Signed)
Pt states pain to the right upper back tooth started this past weekend and now he has a sore/itchy throat.

## 2020-10-27 NOTE — ED Provider Notes (Signed)
[facility]-EMERGENCY DEPARTMENT  ____________________________________________  Time seen: Approximately 9:20 PM  I have reviewed the triage vital signs and the nursing notes.   HISTORY  Chief Complaint Dental Pain   Historian Patient    HPI Stanley Cobb is a 40 y.o. male presents to the emergency department with right upper dental pain. Patient has no pain underneath the tongue or difficulty swallowing. Patient also states that he has had a sore throat.  He denies rhinorrhea, nasal congestion or nonproductive cough.  He has not made an appointment with a local dentist yet.  No other alleviating measures have been attempted.   Past Medical History:  Diagnosis Date  . Gastric ulcer   . Psoriasis      Immunizations up to date:  Yes.     Past Medical History:  Diagnosis Date  . Gastric ulcer   . Psoriasis     There are no problems to display for this patient.   History reviewed. No pertinent surgical history.  Prior to Admission medications   Medication Sig Start Date End Date Taking? Authorizing Provider  amoxicillin (AMOXIL) 875 MG tablet Take 1 tablet (875 mg total) by mouth 2 (two) times daily. 10/27/20  Yes Pia Mau M, PA-C  albuterol (PROVENTIL HFA;VENTOLIN HFA) 108 (90 Base) MCG/ACT inhaler Inhale 2 puffs into the lungs every 6 (six) hours as needed for wheezing or shortness of breath. 10/17/18   Nita Sickle, MD  augmented betamethasone dipropionate (DIPROLENE-AF) 0.05 % ointment Apply topically 2 (two) times daily. 02/15/17   Joni Reining, PA-C  calcipotriene (DOVONOX) 0.005 % cream Apply topically 2 (two) times daily. 11/15/16   Cuthriell, Delorise Royals, PA-C  esomeprazole (NEXIUM) 40 MG capsule Take 1 capsule (40 mg total) by mouth daily. 10/25/19 10/24/20  Charlynne Pander, MD  magic mouthwash w/lidocaine SOLN Take 5 mLs by mouth 4 (four) times daily. 06/02/19   Cuthriell, Delorise Royals, PA-C  omeprazole (PRILOSEC) 40 MG capsule Take 1 capsule (40 mg  total) by mouth daily. 10/16/17   Fisher, Roselyn Bering, PA-C  sucralfate (CARAFATE) 1 g tablet Take 1 tablet (1 g total) by mouth 2 (two) times daily. 10/25/19 10/24/20  Charlynne Pander, MD    Allergies Patient has no known allergies.  No family history on file.  Social History Social History   Tobacco Use  . Smoking status: Current Some Day Smoker  . Smokeless tobacco: Never Used  Substance Use Topics  . Alcohol use: Yes    Comment: occ  . Drug use: No     Review of Systems  Constitutional: No fever/chills Eyes:  No discharge ENT: No upper respiratory complaints. Respiratory: no cough. No SOB/ use of accessory muscles to breath Gastrointestinal:   No nausea, no vomiting.  No diarrhea.  No constipation. Musculoskeletal: Negative for musculoskeletal pain. Skin: Negative for rash, abrasions, lacerations, ecchymosis.   ____________________________________________   PHYSICAL EXAM:  VITAL SIGNS: ED Triage Vitals [10/27/20 2027]  Enc Vitals Group     BP (!) 144/97     Pulse Rate 95     Resp 18     Temp 98.1 F (36.7 C)     Temp Source Oral     SpO2 98 %     Weight 235 lb (106.6 kg)     Height 6\' 2"  (1.88 m)     Head Circumference      Peak Flow      Pain Score 8     Pain Loc  Pain Edu?      Excl. in GC?      Constitutional: Alert and oriented. Well appearing and in no acute distress. Eyes: Conjunctivae are normal. PERRL. EOMI. Head: Atraumatic. ENT:      Ears:       Nose: No congestion/rhinnorhea.  Patient has some gingival hypertrophy along the right upper jaw.       Mouth/Throat: Mucous membranes are moist.  No significant dental caries.  No pain to palpation underneath the tongue. Neck: No stridor.  No cervical spine tenderness to palpation. Cardiovascular: Normal rate, regular rhythm. Normal S1 and S2.  Good peripheral circulation. Respiratory: Normal respiratory effort without tachypnea or retractions. Lungs CTAB. Good air entry to the bases with no  decreased or absent breath sounds Gastrointestinal: Bowel sounds x 4 quadrants. Soft and nontender to palpation. No guarding or rigidity. No distention. Musculoskeletal: Full range of motion to all extremities. No obvious deformities noted Neurologic:  Normal for age. No gross focal neurologic deficits are appreciated.  Skin:  Skin is warm, dry and intact. No rash noted. Psychiatric: Mood and affect are normal for age. Speech and behavior are normal.   ____________________________________________   LABS (all labs ordered are listed, but only abnormal results are displayed)  Labs Reviewed - No data to display ____________________________________________  EKG   ____________________________________________  RADIOLOGY   No results found.  ____________________________________________    PROCEDURES  Procedure(s) performed:     Procedures     Medications - No data to display   ____________________________________________   INITIAL IMPRESSION / ASSESSMENT AND PLAN / ED COURSE  Pertinent labs & imaging results that were available during my care of the patient were reviewed by me and considered in my medical decision making (see chart for details).      Assessment and plan Dental pain 40 year old male presents to the emergency department with right upper jaw dental pain for the past 2 days.  Patient was treated with amoxicillin twice daily for the next 10 days.  Recommended Tylenol and ibuprofen alternating for discomfort.  Dental resources were provided in his discharge paperwork.  All patient questions were answered.     ____________________________________________  FINAL CLINICAL IMPRESSION(S) / ED DIAGNOSES  Final diagnoses:  Pain, dental      NEW MEDICATIONS STARTED DURING THIS VISIT:  ED Discharge Orders         Ordered    amoxicillin (AMOXIL) 875 MG tablet  2 times daily        10/27/20 2111              This chart was dictated using  voice recognition software/Dragon. Despite best efforts to proofread, errors can occur which can change the meaning. Any change was purely unintentional.     Gasper Lloyd 10/27/20 2124    Phineas Semen, MD 10/27/20 2152

## 2020-10-27 NOTE — ED Triage Notes (Signed)
Patient with complaint of right upper dental pain times two days.

## 2021-03-30 ENCOUNTER — Encounter: Payer: Self-pay | Admitting: Physician Assistant

## 2021-03-30 ENCOUNTER — Telehealth: Payer: Self-pay | Admitting: Physician Assistant

## 2021-03-30 ENCOUNTER — Other Ambulatory Visit: Payer: Self-pay

## 2021-03-30 ENCOUNTER — Emergency Department
Admission: EM | Admit: 2021-03-30 | Discharge: 2021-03-30 | Disposition: A | Discharge status: Home / Self Care | Payer: Self-pay | Attending: Emergency Medicine | Admitting: Emergency Medicine

## 2021-03-30 DIAGNOSIS — F17290 Nicotine dependence, other tobacco product, uncomplicated: Secondary | ICD-10-CM | POA: Insufficient documentation

## 2021-03-30 DIAGNOSIS — U071 COVID-19: Secondary | ICD-10-CM | POA: Insufficient documentation

## 2021-03-30 DIAGNOSIS — J069 Acute upper respiratory infection, unspecified: Secondary | ICD-10-CM | POA: Insufficient documentation

## 2021-03-30 LAB — RESP PANEL BY RT-PCR (FLU A&B, COVID) ARPGX2
Influenza A by PCR: NEGATIVE
Influenza B by PCR: NEGATIVE
SARS Coronavirus 2 by RT PCR: POSITIVE — AB

## 2021-03-30 MED ORDER — AMOXICILLIN-POT CLAVULANATE 875-125 MG PO TABS: 1.0000 | ORAL_TABLET | Freq: Two times a day (BID) | ORAL | 0 refills | Status: AC

## 2021-03-30 MED ORDER — ALBUTEROL SULFATE HFA 108 (90 BASE) MCG/ACT IN AERS: 2.0000 | INHALATION_SPRAY | Freq: Four times a day (QID) | RESPIRATORY_TRACT | 1 refills | Status: AC | PRN

## 2021-03-30 MED ORDER — PREDNISONE 20 MG PO TABS: 40.0000 mg | ORAL_TABLET | Freq: Every day | ORAL | 0 refills | Status: AC

## 2021-03-30 MED ORDER — NIRMATRELVIR/RITONAVIR (PAXLOVID)TABLET: 3.0000 | ORAL_TABLET | Freq: Two times a day (BID) | ORAL | 0 refills | Status: AC

## 2021-03-30 NOTE — Discharge Instructions (Addendum)
Take the antibiotic as directed and the steroid as prescribed.  Use albuterol inhaler as needed for shortness of breath.  Follow-up with a local urgent care or return to the ED if needed.

## 2021-03-30 NOTE — Telephone Encounter (Cosign Needed)
-----------------------------------------   2:30 PM on 03/30/2021 -----------------------------------------  Called notify patient of his positive COVID PCR test.  Patient will except a Paxlovid prescription.  It will be submitted to the [facility] on gram of [place] as requested.

## 2021-03-30 NOTE — ED Triage Notes (Signed)
Pt reports that he has had sinus pressure for the last 3 days. He is having the pressure behind both eyes and goes to the back of his head. He also has a cough with green phlem.

## 2021-03-30 NOTE — ED Provider Notes (Signed)
[facility] Emergency Department Provider Note ____________________________________________  Time seen: 1153  I have reviewed the triage vital signs and the nursing notes.  HISTORY  Chief Complaint  Facial Pain and Cough   HPI Stanley Cobb is a 40 y.o. male presents to the ED with complaints of sinus pressure for the last 3 days.  He also reports some sinus congestion and some productive cough.  Patient denies any frank fevers.  He has been taking Tylenol Motrin without significant benefit to his complaints.  Past Medical History:  Diagnosis Date   Gastric ulcer    Psoriasis     There are no problems to display for this patient.   History reviewed. No pertinent surgical history.  Prior to Admission medications   Medication Sig Start Date End Date Taking? Authorizing Provider  amoxicillin-clavulanate (AUGMENTIN) 875-125 MG tablet Take 1 tablet by mouth 2 (two) times daily for 10 days. 03/30/21 04/09/21 Yes Stanley Cobb, Charlesetta Ivory, PA-C  predniSONE (DELTASONE) 20 MG tablet Take 2 tablets (40 mg total) by mouth daily with breakfast for 5 days. 03/30/21 04/04/21 Yes Stanley Cobb, Charlesetta Ivory, PA-C  albuterol (VENTOLIN HFA) 108 (90 Base) MCG/ACT inhaler Inhale 2 puffs into the lungs every 6 (six) hours as needed for wheezing or shortness of breath. 03/30/21   Stanley Cobb, Charlesetta Ivory, PA-C  augmented betamethasone dipropionate (DIPROLENE-AF) 0.05 % ointment Apply topically 2 (two) times daily. 02/15/17   Stanley Reining, PA-C  calcipotriene (DOVONOX) 0.005 % cream Apply topically 2 (two) times daily. 11/15/16   Stanley Cobb, Stanley Royals, PA-C  esomeprazole (NEXIUM) 40 MG capsule Take 1 capsule (40 mg total) by mouth daily. 10/25/19 10/24/20  Stanley Pander, MD  magic mouthwash w/lidocaine SOLN Take 5 mLs by mouth 4 (four) times daily. 06/02/19   Stanley Cobb, Stanley Royals, PA-C  omeprazole (PRILOSEC) 40 MG capsule Take 1 capsule (40 mg total) by mouth daily. 10/16/17   Stanley Cobb,  Stanley Bering, PA-C  sucralfate (CARAFATE) 1 g tablet Take 1 tablet (1 g total) by mouth 2 (two) times daily. 10/25/19 10/24/20  Stanley Pander, MD    Allergies Cyclosporine  History reviewed. No pertinent family history.  Social History Social History   Tobacco Use   Smoking status: Some Days   Smokeless tobacco: Never  Substance Use Topics   Alcohol use: Yes    Comment: occ   Drug use: No    Review of Systems  Constitutional: Negative for fever. Eyes: Negative for visual changes. ENT: Negative for sore throat.  Reports sinus congestion as above. Cardiovascular: Negative for chest pain. Respiratory: Negative for shortness of breath. Gastrointestinal: Negative for abdominal pain, vomiting and diarrhea. Genitourinary: Negative for dysuria. Musculoskeletal: Negative for back pain. Skin: Negative for rash. Neurological: Negative for headaches, focal weakness or numbness. ____________________________________________  PHYSICAL EXAM:  VITAL SIGNS: ED Triage Vitals  Enc Vitals Group     BP 03/30/21 1043 (!) 142/91     Pulse Rate 03/30/21 1043 83     Resp 03/30/21 1043 20     Temp 03/30/21 1043 98 F (36.7 C)     Temp Source 03/30/21 1043 Oral     SpO2 03/30/21 1043 97 %     Weight 03/30/21 1044 240 lb (108.9 kg)     Height 03/30/21 1044 6\' 2"  (1.88 m)     Head Circumference --      Peak Flow --      Pain Score 03/30/21 1050 8  Pain Loc --      Pain Edu? --      Excl. in GC? --     Constitutional: Alert and oriented. Well appearing and in no distress. Head: Normocephalic and atraumatic. Eyes: Conjunctivae are normal. Normal extraocular movements Nose: Mild nasal sinus congestion.  Nasal turbinates are pink and moist and mildly edematous.  No rhinorrhea/epistaxis. Mouth/Throat: Mucous membranes are moist. Neck: Supple. No thyromegaly. Hematological/Lymphatic/Immunological: No cervical lymphadenopathy. Cardiovascular: Normal rate, regular rhythm. Normal distal  pulses. Respiratory: Normal respiratory effort. No wheezes/rales/rhonchi. Gastrointestinal: Soft and nontender. No distention. Musculoskeletal: Nontender with normal range of motion in all extremities.  Neurologic:  Normal gait without ataxia. Normal speech and language. No gross focal neurologic deficits are appreciated. Skin:  Skin is warm, dry and intact. No rash noted. Psychiatric: Mood and affect are normal. Patient exhibits appropriate insight and judgment. ____________________________________________    {LABS (pertinent positives/negatives)  Labs Reviewed  RESP PANEL BY RT-PCR (FLU A&B, COVID) ARPGX2 - Abnormal; Notable for the following components:      Result Value   SARS Coronavirus 2 by RT PCR POSITIVE (*)    All other components within normal limits  ____________________________________________  {EKG  ____________________________________________   RADIOLOGY Official radiology report(s): No results found. ____________________________________________  PROCEDURES   Procedures ____________________________________________   INITIAL IMPRESSION / ASSESSMENT AND PLAN / ED COURSE  As part of my medical decision making, I reviewed the following data within the electronic MEDICAL RECORD NUMBER Labs reviewed as above and Notes from prior ED visits    DDX: covid, sinusitis, influenza   ----------------------------------------- 2:29 PM on 03/30/2021 ----------------------------------------- Notified via telephone of his recently reported positive COVID test.  Patient has requested a prescription for Paxlovid which will be submitted to the [facility] in [facility] wrote as requested.  Patient remain out of work for the remainder of this week which is appropriate.  Return precautions have been reviewed.  Stanley Cobb was evaluated in Emergency Department on 03/30/2021 for the symptoms described in the history of present illness. He was evaluated in the context of the global  COVID-19 pandemic, which necessitated consideration that the patient might be at risk for infection with the SARS-CoV-2 virus that causes COVID-19. Institutional protocols and algorithms that pertain to the evaluation of patients at risk for COVID-19 are in a state of rapid change based on information released by regulatory bodies including the CDC and federal and state organizations. These policies and algorithms were followed during the patient's care in the ED. ____________________________________________  FINAL CLINICAL IMPRESSION(S) / ED DIAGNOSES  Final diagnoses:  Viral URI with cough  COVID-19       Lissa Hoard, PA-C 03/30/21 1430    Chesley Noon, MD 04/02/21 [phone]

## 2021-05-29 ENCOUNTER — Other Ambulatory Visit: Payer: Self-pay

## 2021-05-29 ENCOUNTER — Encounter: Payer: Self-pay | Admitting: *Deleted

## 2021-05-29 DIAGNOSIS — R1011 Right upper quadrant pain: Secondary | ICD-10-CM | POA: Insufficient documentation

## 2021-05-29 DIAGNOSIS — F17200 Nicotine dependence, unspecified, uncomplicated: Secondary | ICD-10-CM | POA: Insufficient documentation

## 2021-05-29 LAB — COMPREHENSIVE METABOLIC PANEL
ALT: 51 U/L — ABNORMAL HIGH (ref 0–44)
AST: 52 U/L — ABNORMAL HIGH (ref 15–41)
Albumin: 4.2 g/dL (ref 3.5–5.0)
Alkaline Phosphatase: 62 U/L (ref 38–126)
Anion gap: 17 — ABNORMAL HIGH (ref 5–15)
BUN: 13 mg/dL (ref 6–20)
CO2: 22 mmol/L (ref 22–32)
Calcium: 9.3 mg/dL (ref 8.9–10.3)
Chloride: 100 mmol/L (ref 98–111)
Creatinine, Ser: 1.24 mg/dL (ref 0.61–1.24)
GFR, Estimated: >60 mL/min (ref >60)
Glucose, Bld: 125 mg/dL — ABNORMAL HIGH (ref 70–99)
Potassium: 3.7 mmol/L (ref 3.5–5.1)
Sodium: 139 mmol/L (ref 135–145)
Total Bilirubin: 1.2 mg/dL (ref 0.3–1.2)
Total Protein: 7.6 g/dL (ref 6.5–8.1)

## 2021-05-29 LAB — CBC
HCT: 47.1 % (ref 39.0–52.0)
Hemoglobin: 16.7 g/dL (ref 13.0–17.0)
MCH: 32 pg (ref 26.0–34.0)
MCHC: 35.5 g/dL (ref 30.0–36.0)
MCV: 90.2 fL (ref 80.0–100.0)
Platelets: 305 10*3/uL (ref 150–400)
RBC: 5.22 MIL/uL (ref 4.22–5.81)
RDW: 13.1 % (ref 11.5–15.5)
WBC: 7.7 10*3/uL (ref 4.0–10.5)
nRBC: 0 % (ref 0.0–0.2)

## 2021-05-29 LAB — LIPASE, BLOOD: Lipase: 42 U/L (ref 11–51)

## 2021-05-29 NOTE — ED Triage Notes (Signed)
Pt to ED reporting right sided abd pain that is tender upon palpation. Pain has been worsening since yesterday and feeling like spasms. Pt reporting he has been drinking more alcohol lately but reports usually a couple 40s every other day. NO NVD. NO fevers. NO dark tar like stools.

## 2021-05-30 ENCOUNTER — Emergency Department: Payer: Self-pay

## 2021-05-30 ENCOUNTER — Emergency Department
Admission: EM | Admit: 2021-05-30 | Discharge: 2021-05-30 | Disposition: A | Discharge status: Home / Self Care | Payer: Self-pay | Attending: Emergency Medicine | Admitting: Emergency Medicine

## 2021-05-30 DIAGNOSIS — R1011 Right upper quadrant pain: Secondary | ICD-10-CM

## 2021-05-30 LAB — URINALYSIS, ROUTINE W REFLEX MICROSCOPIC
Bilirubin Urine: NEGATIVE
Glucose, UA: NEGATIVE mg/dL
Hgb urine dipstick: NEGATIVE
Ketones, ur: 5 mg/dL — AB
Leukocytes,Ua: NEGATIVE
Nitrite: NEGATIVE
Protein, ur: NEGATIVE mg/dL
Specific Gravity, Urine: 1.024 (ref 1.005–1.030)
pH: 5 (ref 5.0–8.0)

## 2021-05-30 MED ORDER — KETOROLAC TROMETHAMINE 30 MG/ML IJ SOLN: 15.0000 mg | Freq: Once | INTRAMUSCULAR | Status: DC

## 2021-05-30 MED ORDER — ONDANSETRON HCL 4 MG/2ML IJ SOLN: 4.0000 mg | Freq: Once | INTRAMUSCULAR | Status: DC

## 2021-05-30 MED ORDER — IOHEXOL 300 MG/ML  SOLN
100.0000 mL | Freq: Once | INTRAMUSCULAR | Status: AC | PRN
  Administered 2021-05-30: 100 mL via INTRAVENOUS

## 2021-05-30 MED ORDER — DICYCLOMINE HCL 10 MG PO CAPS: 10.0000 mg | ORAL_CAPSULE | Freq: Three times a day (TID) | ORAL | 0 refills | Status: DC

## 2021-05-30 MED ORDER — FENTANYL CITRATE PF 50 MCG/ML IJ SOSY
50.0000 ug | PREFILLED_SYRINGE | Freq: Once | INTRAMUSCULAR | Status: DC
Start: 2021-05-30 — End: 2021-05-30

## 2021-05-30 NOTE — ED Provider Notes (Signed)
[facility] Emergency Department Provider Note  ____________________________________________  Time seen: Approximately 6:39 AM  I have reviewed the triage vital signs and the nursing notes.   HISTORY  Chief Complaint Abdominal Pain   HPI Stanley Cobb is a 40 y.o. male with history of peptic ulcer disease and psoriasis who presents for evaluation of abdominal pain.  Pain started yesterday evening while patient was drinking.  Patient reports that he drinks a couple of 40 ounce beers every other day.  Describes the pain as sharp, constant, located in the right upper quadrant and nonradiating.  Currently 8 out of 10.  No nausea, vomiting, diarrhea, chest pain, shortness of breath.  Past Medical History:  Diagnosis Date   Gastric ulcer    Psoriasis     There are no problems to display for this patient.   History reviewed. No pertinent surgical history.  Prior to Admission medications   Medication Sig Start Date End Date Taking? Authorizing Provider  albuterol (VENTOLIN HFA) 108 (90 Base) MCG/ACT inhaler Inhale 2 puffs into the lungs every 6 (six) hours as needed for wheezing or shortness of breath. 03/30/21   Menshew, Charlesetta Ivory, PA-C  augmented betamethasone dipropionate (DIPROLENE-AF) 0.05 % ointment Apply topically 2 (two) times daily. 02/15/17   Joni Reining, PA-C  calcipotriene (DOVONOX) 0.005 % cream Apply topically 2 (two) times daily. 11/15/16   Cuthriell, Delorise Royals, PA-C  esomeprazole (NEXIUM) 40 MG capsule Take 1 capsule (40 mg total) by mouth daily. 10/25/19 10/24/20  Charlynne Pander, MD  magic mouthwash w/lidocaine SOLN Take 5 mLs by mouth 4 (four) times daily. 06/02/19   Cuthriell, Delorise Royals, PA-C  omeprazole (PRILOSEC) 40 MG capsule Take 1 capsule (40 mg total) by mouth daily. 10/16/17   Fisher, Roselyn Bering, PA-C  sucralfate (CARAFATE) 1 g tablet Take 1 tablet (1 g total) by mouth 2 (two) times daily. 10/25/19 10/24/20  Charlynne Pander, MD     Allergies Cyclosporine  History reviewed. No pertinent family history.  Social History Social History   Tobacco Use   Smoking status: Some Days   Smokeless tobacco: Never  Substance Use Topics   Alcohol use: Yes    Comment: occ   Drug use: No    Review of Systems  Constitutional: Negative for fever. Eyes: Negative for visual changes. ENT: Negative for sore throat. Neck: No neck pain  Cardiovascular: Negative for chest pain. Respiratory: Negative for shortness of breath. Gastrointestinal: + RUQ abdominal pain. No vomiting or diarrhea. Genitourinary: Negative for dysuria. Musculoskeletal: Negative for back pain. Skin: Negative for rash. Neurological: Negative for headaches, weakness or numbness. Psych: No SI or HI  ____________________________________________   PHYSICAL EXAM:  VITAL SIGNS: ED Triage Vitals  Enc Vitals Group     BP 05/29/21 2202 (!) 155/98     Pulse Rate 05/29/21 2202 97     Resp 05/29/21 2202 16     Temp 05/29/21 2202 98.3 F (36.8 C)     Temp Source 05/29/21 2202 Oral     SpO2 05/29/21 2202 100 %     Weight 05/29/21 2203 240 lb (108.9 kg)     Height 05/29/21 2203 6\' 2"  (1.88 m)     Head Circumference --      Peak Flow --      Pain Score 05/29/21 2203 8     Pain Loc --      Pain Edu? --      Excl. in GC? --  Constitutional: Alert and oriented. Well appearing and in no apparent distress. HEENT:      Head: Normocephalic and atraumatic.         Eyes: Conjunctivae are normal. Sclera is non-icteric.       Mouth/Throat: Mucous membranes are moist.       Neck: Supple with no signs of meningismus. Cardiovascular: Regular rate and rhythm. No murmurs, gallops, or rubs. 2+ symmetrical distal pulses are present in all extremities. No JVD. Respiratory: Normal respiratory effort. Lungs are clear to auscultation bilaterally.  Gastrointestinal: Soft, tender to palpation the right upper quadrant, non distended with positive bowel sounds. No  rebound or guarding. Genitourinary: No CVA tenderness. Musculoskeletal:  No edema, cyanosis, or erythema of extremities. Neurologic: Normal speech and language. Face is symmetric. Moving all extremities. No gross focal neurologic deficits are appreciated. Skin: Skin is warm, dry and intact. No rash noted. Psychiatric: Mood and affect are normal. Speech and behavior are normal.  ____________________________________________   LABS (all labs ordered are listed, but only abnormal results are displayed)  Labs Reviewed  COMPREHENSIVE METABOLIC PANEL - Abnormal; Notable for the following components:      Result Value   Glucose, Bld 125 (*)    AST 52 (*)    ALT 51 (*)    Anion gap 17 (*)    All other components within normal limits  LIPASE, BLOOD  CBC  URINALYSIS, ROUTINE W REFLEX MICROSCOPIC   ____________________________________________  EKG  none  ____________________________________________  RADIOLOGY  RUQ [place]: PND   ____________________________________________   PROCEDURES  Procedure(s) performed: None Procedures   Critical Care performed:  None ____________________________________________   INITIAL IMPRESSION / ASSESSMENT AND PLAN / ED COURSE  40 y.o. male with history of peptic ulcer disease and psoriasis who presents for evaluation of abdominal pain.  Patient is well-appearing in no distress with normal vital signs.  Abdomen is soft with tenderness palpation the right upper quadrant with negative Murphy sign, no guarding or rebound.  No distention.  Differential diagnosis including gallbladder pathology versus hepatitis versus pancreatitis versus peptic ulcer disease versus gastritis versus kidney stone versus pyelonephritis versus diverticulitis.  Labs showing normal white count, no signs of anemia, mildly elevated LFTs which is baseline for patient, normal kidney function with no electrolyte derangements.  Normal lipase.  Right upper quadrant ultrasound is  pending.  Urine is pending.  Will treat pain with IV fentanyl and Zofran.  Old medical records reviewed including patient's last visit for his psoriasis, patient is on injectable secukinumab for Psoriasis.      _____________________________________________ Please note:  Patient was evaluated in Emergency Department today for the symptoms described in the history of present illness. Patient was evaluated in the context of the global COVID-19 pandemic, which necessitated consideration that the patient might be at risk for infection with the SARS-CoV-2 virus that causes COVID-19. Institutional protocols and algorithms that pertain to the evaluation of patients at risk for COVID-19 are in a state of rapid change based on information released by regulatory bodies including the CDC and federal and state organizations. These policies and algorithms were followed during the patient's care in the ED.  Some ED evaluations and interventions may be delayed as a result of limited staffing during the pandemic.   [place] Controlled Substance Database was reviewed by me. ____________________________________________   FINAL CLINICAL IMPRESSION(S) / ED DIAGNOSES   Final diagnoses:  RUQ abdominal pain      NEW MEDICATIONS STARTED DURING THIS VISIT:  ED Discharge Orders  None        Note:  This document was prepared using Dragon voice recognition software and may include unintentional dictation errors.    Nita Sickle, MD 05/30/21 [phone]

## 2021-05-30 NOTE — ED Provider Notes (Signed)
Ultrasound reassuring, CT abdomen pelvis without acute abnormality.  Discussed with patient fatty liver and need for follow-up.  Will Rx analgesics, if no improvement within 24 hours return to the emergency department   Jene Every, MD 05/30/21 [phone]

## 2021-05-30 NOTE — ED Notes (Signed)
US at bedside

## 2021-07-30 ENCOUNTER — Emergency Department: Payer: Self-pay

## 2021-07-30 ENCOUNTER — Other Ambulatory Visit: Payer: Self-pay

## 2021-07-30 ENCOUNTER — Encounter: Payer: Self-pay | Admitting: Emergency Medicine

## 2021-07-30 ENCOUNTER — Emergency Department
Admission: EM | Admit: 2021-07-30 | Discharge: 2021-07-30 | Disposition: A | Discharge status: Home / Self Care | Payer: Self-pay | Attending: Emergency Medicine | Admitting: Emergency Medicine

## 2021-07-30 DIAGNOSIS — F17200 Nicotine dependence, unspecified, uncomplicated: Secondary | ICD-10-CM | POA: Insufficient documentation

## 2021-07-30 DIAGNOSIS — J40 Bronchitis, not specified as acute or chronic: Secondary | ICD-10-CM | POA: Insufficient documentation

## 2021-07-30 DIAGNOSIS — Z20822 Contact with and (suspected) exposure to covid-19: Secondary | ICD-10-CM | POA: Insufficient documentation

## 2021-07-30 LAB — RESP PANEL BY RT-PCR (FLU A&B, COVID) ARPGX2
Influenza A by PCR: NEGATIVE
Influenza B by PCR: NEGATIVE
SARS Coronavirus 2 by RT PCR: NEGATIVE

## 2021-07-30 MED ORDER — PREDNISONE 20 MG PO TABS
60.0000 mg | ORAL_TABLET | Freq: Once | ORAL | Status: DC
  Filled 2021-07-30: qty 3

## 2021-07-30 MED ORDER — AZITHROMYCIN 250 MG PO TABS: ORAL_TABLET | ORAL | 0 refills | Status: AC

## 2021-07-30 MED ORDER — PREDNISONE 20 MG PO TABS: 40.0000 mg | ORAL_TABLET | Freq: Every day | ORAL | 0 refills | Status: AC

## 2021-07-30 MED ORDER — AZITHROMYCIN 500 MG PO TABS
500.0000 mg | ORAL_TABLET | Freq: Once | ORAL | Status: DC
  Filled 2021-07-30: qty 1

## 2021-07-30 NOTE — ED Triage Notes (Signed)
Pt via POV from home. Pt cough, generalized body aches, and fatigue for last 3 days. Pt is A&OX4 and NAD. States he feels the same as he does when he had COVID before. Pt has a hx of bronchitis.

## 2021-07-30 NOTE — ED Notes (Signed)
Dc instructions and scripts reviewed with pt. No questions or concerns at this time. Will follow up as needed. Ambulated to lobby at time of discharge.

## 2021-07-30 NOTE — Discharge Instructions (Addendum)
You can use your inhaler every 4-6 hours to help with any wheezing and you can take the steroids antibiotics to help clear up the infection

## 2021-07-30 NOTE — ED Provider Notes (Signed)
[facility] Emergency Department Provider Note  ____________________________________________   Event Date/Time   First MD Initiated Contact with Patient 07/30/21 1717     (approximate)  I have reviewed the triage vital signs    HISTORY  Chief Complaint Cough    HPI Stanley Cobb is a 40 y.o. male who presents with viral symptoms. Pt reports multiple symptoms including body aches, fatigue, little bit of shortness of breath with walking, coughing over the past 3 days, nothing makes it better or worse.  Reports a physical he had COVID previously.  Symptoms are moderate.     Past Medical History:  Diagnosis Date   Gastric ulcer    Psoriasis     There are no problems to display for this patient.   History reviewed. No pertinent surgical history.  Prior to Admission medications   Medication Sig Start Date End Date Taking? Authorizing Provider  albuterol (VENTOLIN HFA) 108 (90 Base) MCG/ACT inhaler Inhale 2 puffs into the lungs every 6 (six) hours as needed for wheezing or shortness of breath. 03/30/21   Menshew, Charlesetta Ivory, PA-C  augmented betamethasone dipropionate (DIPROLENE-AF) 0.05 % ointment Apply topically 2 (two) times daily. 02/15/17   Joni Reining, PA-C  calcipotriene (DOVONOX) 0.005 % cream Apply topically 2 (two) times daily. 11/15/16   Cuthriell, Delorise Royals, PA-C  dicyclomine (BENTYL) 10 MG capsule Take 1 capsule (10 mg total) by mouth 4 (four) times daily -  before meals and at bedtime. 05/30/21   Jene Every, MD  esomeprazole (NEXIUM) 40 MG capsule Take 1 capsule (40 mg total) by mouth daily. 10/25/19 10/24/20  Charlynne Pander, MD  magic mouthwash w/lidocaine SOLN Take 5 mLs by mouth 4 (four) times daily. 06/02/19   Cuthriell, Delorise Royals, PA-C  omeprazole (PRILOSEC) 40 MG capsule Take 1 capsule (40 mg total) by mouth daily. 10/16/17   Fisher, Roselyn Bering, PA-C  sucralfate (CARAFATE) 1 g tablet Take 1 tablet (1 g total) by mouth 2  (two) times daily. 10/25/19 10/24/20  Charlynne Pander, MD    Allergies Cyclosporine  History reviewed. No pertinent family history.  Social History Social History   Tobacco Use   Smoking status: Some Days   Smokeless tobacco: Never  Substance Use Topics   Alcohol use: Yes    Comment: occ   Drug use: No      Review of Systems Constitutional: No fever/chills, body aches Eyes: No visual changes. ENT: No sore throat.  Congestion Cardiovascular: Denies chest pain. Respiratory: Denies severe shortness of breath, cough Gastrointestinal: No abdominal pain.  No nausea, no vomiting.  No diarrhea.  No constipation. Genitourinary: Negative for dysuria. Musculoskeletal: Negative for back pain. Skin: Negative for rash. Neurological: Negative for headaches, focal weakness or numbness. All other ROS negative ____________________________________________   PHYSICAL EXAM:  VITAL SIGNS: ED Triage Vitals  Enc Vitals Group     BP 07/30/21 1658 (!) 152/92     Pulse Rate 07/30/21 1658 97     Resp 07/30/21 1658 (!) 22     Temp 07/30/21 1658 97.7 F (36.5 C)     Temp Source 07/30/21 1658 Oral     SpO2 07/30/21 1658 100 %     Weight 07/30/21 1656 240 lb (108.9 kg)     Height 07/30/21 1656 6\' 2"  (1.88 m)     Head Circumference --      Peak Flow --      Pain Score --  Pain Loc --      Pain Edu? --      Excl. in GC? --     Constitutional: Alert and oriented. Well appearing and in no acute distress. Eyes: Conjunctivae are normal. EOMI. Head: Atraumatic. Nose: No congestion/rhinnorhea. Mouth/Throat: Mucous membranes are moist.   Neck: No stridor. Trachea Midline. FROM Cardiovascular: Normal rate, regular rhythm. Good peripheral circulation. Respiratory: no audible stridor, no increased work of breathing  Gastrointestinal: Soft and nontender. No distention.  Musculoskeletal: No lower extremity tenderness nor edema.  No joint effusions. Neurologic:  Normal speech and language.  No gross focal neurologic deficits are appreciated.  Skin:  Skin is warm, dry and intact. No rash noted. Psychiatric: Mood and affect are normal. Speech and behavior are normal. GU: Deferred   ____________________________________________   LABS (all labs ordered are listed, but only abnormal results are displayed)  Labs Reviewed  RESP PANEL BY RT-PCR (FLU A&B, COVID) ARPGX2   ____________________________________________   RADIOLOGY Vela Prose, personally viewed and evaluated these images (plain radiographs) as part of my medical decision making, as well as reviewing the written report by the radiologist.  ED MD interpretation: No pneumonia  Official radiology report(s): DG Chest 2 View  Result Date: 07/30/2021 CLINICAL DATA:  Cough, shortness of breath and body aches over the last 3 days. EXAM: CHEST - 2 VIEW COMPARISON:  10/17/2018 FINDINGS: Heart and mediastinal shadows are normal. The lungs are clear. There may be mild central bronchial thickening. No infiltrate, collapse or effusion. Bony structures are normal. IMPRESSION: Question mild bronchitis pattern.  No consolidation or collapse. Electronically Signed   By: Paulina Fusi M.D.   On: 07/30/2021 17:56    ____________________________________________   PROCEDURES  Procedure(s) performed (including Critical Care):  Procedures   ____________________________________________   INITIAL IMPRESSION / ASSESSMENT AND PLAN / ED COURSE  Stanley Cobb was evaluated in Emergency Department on 07/30/2021 for the symptoms described in the history of present illness. He was evaluated in the context of the global COVID-19 pandemic, which necessitated consideration that the patient might be at risk for infection with the SARS-CoV-2 virus that causes COVID-19. Institutional protocols and algorithms that pertain to the evaluation of patients at risk for COVID-19 are in a state of rapid change based on information released by  regulatory bodies including the CDC and federal and state organizations. These policies and algorithms were followed during the patient's care in the ED.     Pt presents with multiple symptoms.  Given the prevalence of  COVID 19 I suspect this is mostly likely secondary to viral illness such as COVID 19.  Pt very well appearing. Well hydrated on exam, low suspicion for electrolyte abnormalities or AKI. Pt not hypoxic and breathing well therefore does not require admission to hospital.  Low suspicion of PE/ACS given no chest pain/SOB and so well appearing.   COVID, flu, chest x-ray are all reassuring no evidence of pneumonia.  There is concern for potentially some bronchitis  Given patient's multiple symptoms in the setting of bronchitis we will give patient some steroids and some antibiotics to help clear up the infection.  Patient does have an inhaler at home.  Patient expressed understanding felt comfortable with this plan.  Patient has an inhaler at home.  I discussed the provisional nature of ED diagnosis, the treatment so far, the ongoing plan of care, follow up appointments and return precautions with the patient and any family or support people present. They expressed understanding  and agreed with the plan, discharged home.         ____________________________________________   FINAL CLINICAL IMPRESSION(S) / ED DIAGNOSES   Final diagnoses:  Bronchitis      MEDICATIONS GIVEN DURING THIS VISIT:  Medications - No data to display   ED Discharge Orders          Ordered    predniSONE (DELTASONE) 20 MG tablet  Daily with breakfast        07/30/21 1818    azithromycin (ZITHROMAX Z-PAK) 250 MG tablet        07/30/21 1818             Note:  This document was prepared using Dragon voice recognition software and may include unintentional dictation errors.   Concha Se, MD 07/30/21 [phone]

## 2021-08-24 ENCOUNTER — Other Ambulatory Visit: Payer: Self-pay

## 2021-08-24 ENCOUNTER — Encounter: Payer: Self-pay | Admitting: Emergency Medicine

## 2021-08-24 ENCOUNTER — Emergency Department
Admission: EM | Admit: 2021-08-24 | Discharge: 2021-08-24 | Disposition: A | Discharge status: Home / Self Care | Payer: Self-pay | Attending: Emergency Medicine | Admitting: Emergency Medicine

## 2021-08-24 ENCOUNTER — Emergency Department: Payer: Self-pay

## 2021-08-24 DIAGNOSIS — Z20822 Contact with and (suspected) exposure to covid-19: Secondary | ICD-10-CM | POA: Insufficient documentation

## 2021-08-24 DIAGNOSIS — J40 Bronchitis, not specified as acute or chronic: Secondary | ICD-10-CM | POA: Insufficient documentation

## 2021-08-24 LAB — RESP PANEL BY RT-PCR (FLU A&B, COVID) ARPGX2
Influenza A by PCR: NEGATIVE
Influenza B by PCR: NEGATIVE
SARS Coronavirus 2 by RT PCR: NEGATIVE

## 2021-08-24 MED ORDER — PSEUDOEPH-BROMPHEN-DM 30-2-10 MG/5ML PO SYRP: 10.0000 mL | ORAL_SOLUTION | Freq: Four times a day (QID) | ORAL | 0 refills | Status: DC | PRN

## 2021-08-24 MED ORDER — PREDNISONE 50 MG PO TABS: 50.0000 mg | ORAL_TABLET | Freq: Every day | ORAL | 0 refills | Status: DC

## 2021-08-24 MED ORDER — BENZONATATE 100 MG PO CAPS: 100.0000 mg | ORAL_CAPSULE | Freq: Three times a day (TID) | ORAL | 0 refills | Status: DC | PRN

## 2021-08-24 MED ORDER — PREDNISONE 20 MG PO TABS
60.0000 mg | ORAL_TABLET | Freq: Once | ORAL | Status: AC
  Administered 2021-08-24: 60 mg via ORAL
  Filled 2021-08-24: qty 3

## 2021-08-24 NOTE — ED Provider Notes (Signed)
[facility] Provider Note  Patient Contact: 6:46 PM (approximate)   History   Cough and Nasal Congestion   HPI  Stanley Cobb is a 41 y.o. male who presents the emergency department complaining of mild nasal congestion, cough.  Patient has had 2 days of symptoms.  No increased shortness of breath or chest pain.  Patient denies any headache, sore throat, GI symptoms.     Physical Exam   Triage Vital Signs: ED Triage Vitals  Enc Vitals Group     BP 08/24/21 1701 (!) 158/98     Pulse Rate 08/24/21 1701 78     Resp 08/24/21 1701 20     Temp 08/24/21 1701 97.9 F (36.6 C)     Temp Source 08/24/21 1701 Oral     SpO2 08/24/21 1701 93 %     Weight 08/24/21 1556 238 lb 1.6 oz (108 kg)     Height 08/24/21 1556 6\' 2"  (1.88 m)     Head Circumference --      Peak Flow --      Pain Score 08/24/21 1556 8     Pain Loc --      Pain Edu? --      Excl. in GC? --     Most recent vital signs: Vitals:   08/24/21 1701  BP: (!) 158/98  Pulse: 78  Resp: 20  Temp: 97.9 F (36.6 C)  SpO2: 93%     General: Alert and in no acute distress ENT:      Ears:       Nose: Mild congestion/rhinnorhea.      Mouth/Throat: Mucous membranes are moist. Cardiovascular:  Good peripheral perfusion Respiratory: Patient has a dry cough in the exam room.  Normal respiratory effort without tachypnea or retractions. Lungs with minimal faint expiratory wheezing. Good air entry to the bases with no decreased or absent breath sounds. Musculoskeletal: Full range of motion to all extremities.  Neurologic:  No gross focal neurologic deficits are appreciated.  Skin:   No rash noted Other:   ED Results / Procedures / Treatments   Labs (all labs ordered are listed, but only abnormal results are displayed) Labs Reviewed  RESP PANEL BY RT-PCR (FLU A&B, COVID) ARPGX2     EKG     RADIOLOGY  I personally viewed and evaluated these images as part of my medical decision  making, as well as reviewing the written report by the radiologist.  ED Provider Interpretation: No consolidation on chest x-ray concerning for pneumonia.  Appears that patient does have some peribronchial thickening consistent with bronchitis  DG Chest 2 View  Result Date: 08/24/2021 CLINICAL DATA:  Productive cough EXAM: CHEST - 2 VIEW COMPARISON:  07/30/2021. FINDINGS: Cardiac and mediastinal contours are within normal limits. No focal pulmonary opacity. Previously noted central bronchial thickening is no longer appreciated. No pleural effusion or pneumothorax. No acute osseous abnormality. IMPRESSION: No acute cardiopulmonary process. Electronically Signed   By: 08/01/2021 M.D.   On: 08/24/2021 16:24    PROCEDURES:  Critical Care performed: No  Procedures   MEDICATIONS ORDERED IN ED: Medications  predniSONE (DELTASONE) tablet 60 mg (has no administration in time range)     IMPRESSION / MDM / ASSESSMENT AND PLAN / ED COURSE  I reviewed the triage vital signs and the nursing notes.  Differential diagnosis includes, but is not limited to, bronchitis, pneumonia, COVID, influenza   Patient's diagnosis is consistent with bronchitis.  Patient presented to the emergency department complaining of cough and some mild nasal congestion.  Patient tested negative for COVID and influenza.  Chest x-ray revealed some peribronchial thickening with no consolidation concerning for pneumonia.  No increased work of breathing on exam.  Patient will have prednisone, cough medications for symptom relief.  He has a albuterol inhaler and is instructed to use same.  Return precautions discussed with the patient.  Follow-up primary care as needed..  Patient is given ED precautions to return to the ED for any worsening or new symptoms.        FINAL CLINICAL IMPRESSION(S) / ED DIAGNOSES   Final diagnoses:  Bronchitis     Rx / DC Orders   ED Discharge Orders           Ordered    predniSONE (DELTASONE) 50 MG tablet  Daily with breakfast        08/24/21 1848    benzonatate (TESSALON PERLES) 100 MG capsule  3 times daily PRN        08/24/21 1848    brompheniramine-pseudoephedrine-DM 30-2-10 MG/5ML syrup  4 times daily PRN        08/24/21 1848             Note:  This document was prepared using Dragon voice recognition software and may include unintentional dictation errors.   Lanette Hampshire 08/24/21 1849    Minna Antis, MD 08/24/21 2322

## 2021-08-24 NOTE — ED Notes (Signed)
EDP at bedside. Pt being discharged.

## 2021-08-24 NOTE — ED Triage Notes (Signed)
Pt comes into the ED via POV c/o cough and and body aches with congestion.  Pt states the symptoms have been ongoing for 2 days.  Pt has pain and discomfort when coughing. Pt has even and unlabored respirations at this time.

## 2021-11-01 ENCOUNTER — Other Ambulatory Visit: Payer: Self-pay

## 2021-11-01 ENCOUNTER — Emergency Department: Payer: Self-pay

## 2021-11-01 ENCOUNTER — Emergency Department
Admission: EM | Admit: 2021-11-01 | Discharge: 2021-11-02 | Disposition: A | Discharge status: Home / Self Care | Payer: Self-pay | Attending: Emergency Medicine | Admitting: Emergency Medicine

## 2021-11-01 DIAGNOSIS — Z20822 Contact with and (suspected) exposure to covid-19: Secondary | ICD-10-CM | POA: Insufficient documentation

## 2021-11-01 DIAGNOSIS — J069 Acute upper respiratory infection, unspecified: Secondary | ICD-10-CM | POA: Insufficient documentation

## 2021-11-01 DIAGNOSIS — Z87891 Personal history of nicotine dependence: Secondary | ICD-10-CM | POA: Insufficient documentation

## 2021-11-01 LAB — CBC
HCT: 44.8 % (ref 39.0–52.0)
Hemoglobin: 15 g/dL (ref 13.0–17.0)
MCH: 30 pg (ref 26.0–34.0)
MCHC: 33.5 g/dL (ref 30.0–36.0)
MCV: 89.6 fL (ref 80.0–100.0)
Platelets: 252 10*3/uL (ref 150–400)
RBC: 5 MIL/uL (ref 4.22–5.81)
RDW: 12.3 % (ref 11.5–15.5)
WBC: 5.1 10*3/uL (ref 4.0–10.5)
nRBC: 0 % (ref 0.0–0.2)

## 2021-11-01 LAB — COMPREHENSIVE METABOLIC PANEL
ALT: 48 U/L — ABNORMAL HIGH (ref 0–44)
AST: 46 U/L — ABNORMAL HIGH (ref 15–41)
Albumin: 4.3 g/dL (ref 3.5–5.0)
Alkaline Phosphatase: 59 U/L (ref 38–126)
Anion gap: 8 (ref 5–15)
BUN: 10 mg/dL (ref 6–20)
CO2: 24 mmol/L (ref 22–32)
Calcium: 9.1 mg/dL (ref 8.9–10.3)
Chloride: 104 mmol/L (ref 98–111)
Creatinine, Ser: 0.98 mg/dL (ref 0.61–1.24)
GFR, Estimated: >60 mL/min (ref >60)
Glucose, Bld: 105 mg/dL — ABNORMAL HIGH (ref 70–99)
Potassium: 3.9 mmol/L (ref 3.5–5.1)
Sodium: 136 mmol/L (ref 135–145)
Total Bilirubin: 0.9 mg/dL (ref 0.3–1.2)
Total Protein: 7.7 g/dL (ref 6.5–8.1)

## 2021-11-01 LAB — RESP PANEL BY RT-PCR (FLU A&B, COVID) ARPGX2
Influenza A by PCR: NEGATIVE
Influenza B by PCR: NEGATIVE
SARS Coronavirus 2 by RT PCR: NEGATIVE

## 2021-11-01 MED ORDER — IPRATROPIUM-ALBUTEROL 0.5-2.5 (3) MG/3ML IN SOLN: 3.0000 mL | Freq: Once | RESPIRATORY_TRACT | Status: DC

## 2021-11-01 MED ORDER — ALBUTEROL SULFATE HFA 108 (90 BASE) MCG/ACT IN AERS
2.0000 | INHALATION_SPRAY | Freq: Once | RESPIRATORY_TRACT | Status: AC
  Administered 2021-11-02: 2 via RESPIRATORY_TRACT
  Filled 2021-11-01: qty 6.7

## 2021-11-01 MED ORDER — PREDNISONE 20 MG PO TABS
60.0000 mg | ORAL_TABLET | Freq: Once | ORAL | Status: AC
  Administered 2021-11-02: 60 mg via ORAL
  Filled 2021-11-01: qty 3

## 2021-11-01 MED ORDER — PREDNISONE 10 MG (21) PO TBPK: ORAL_TABLET | ORAL | 0 refills | Status: DC

## 2021-11-01 MED ORDER — ALBUTEROL SULFATE HFA 108 (90 BASE) MCG/ACT IN AERS: 2.0000 | INHALATION_SPRAY | RESPIRATORY_TRACT | 0 refills | Status: AC | PRN

## 2021-11-01 MED ORDER — IBUPROFEN 800 MG PO TABS
800.0000 mg | ORAL_TABLET | Freq: Once | ORAL | Status: AC
Start: 2021-11-01 — End: 2021-11-02
  Administered 2021-11-02: 800 mg via ORAL
  Filled 2021-11-01: qty 1

## 2021-11-01 NOTE — ED Triage Notes (Signed)
Chest pain, Shortness of breath x 1 week. Non productive cough. Sore throat. States back pain and feels like lungs are irritated. Denies fever.  ?

## 2021-11-01 NOTE — ED Notes (Signed)
Albuterol inhaler ordered from pharmacy  ?

## 2021-11-01 NOTE — Discharge Instructions (Signed)
You may alternate Tylenol 1000 mg every 6 hours as needed for pain, fever and Ibuprofen 800 mg every 6-8 hours as needed for pain, fever.  Please take Ibuprofen with food.  Do not take more than 4000 mg of Tylenol (acetaminophen) in a 24 hour period. ? ?You may use your albuterol inhaler 2 to 4 puffs every 2-4 hours as needed for shortness of breath and wheezing. ? ?Steps to find a Primary Care Provider (PCP): ? ?Call [phone] or [phone] to access "[place] Find a Doctor Service." ? ?2.  You may also go on the [facility] website at [email or web address] ? ?

## 2021-11-01 NOTE — ED Provider Notes (Signed)
? ?[facility] ?Provider Note ? ? ? Event Date/Time  ? First MD Initiated Contact with Patient 11/01/21 2323   ?  (approximate) ? ? ?History  ? ?Shortness of Breath ? ? ?HPI ? ?Stanley Cobb is a 41 y.o. male with history of tobacco use, psoriasis on Cosentyx who presents to the emergency department with complaints of feeling like he has bronchitis.  Has nonproductive cough, shortness of breath, wheezing, throat discomfort for the past week.  No fever.  Feels like his previous episodes of bronchitis.  States he needs a refill of his albuterol inhaler.  No chest pain. ? ?No history of PE, DVT, exogenous estrogen use, recent fractures, surgery, trauma, hospitalization, prolonged travel or other immobilization. No lower extremity swelling or pain. No calf tenderness. ? ?History provided by patient. ? ? ? ?Past Medical History:  ?Diagnosis Date  ? Gastric ulcer   ? Psoriasis   ? ? ?History reviewed. No pertinent surgical history. ? ?MEDICATIONS:  ?Prior to Admission medications   ?Medication Sig Start Date End Date Taking? Authorizing Provider  ?albuterol (VENTOLIN HFA) 108 (90 Base) MCG/ACT inhaler Inhale 2 puffs into the lungs every 6 (six) hours as needed for wheezing or shortness of breath. 03/30/21   Menshew, Charlesetta Ivory, PA-C  ?augmented betamethasone dipropionate (DIPROLENE-AF) 0.05 % ointment Apply topically 2 (two) times daily. 02/15/17   Joni Reining, PA-C  ?benzonatate (TESSALON PERLES) 100 MG capsule Take 1 capsule (100 mg total) by mouth 3 (three) times daily as needed for cough. 08/24/21 08/24/22  Cuthriell, Delorise Royals, PA-C  ?brompheniramine-pseudoephedrine-DM 30-2-10 MG/5ML syrup Take 10 mLs by mouth 4 (four) times daily as needed. 08/24/21   Cuthriell, Delorise Royals, PA-C  ?calcipotriene (DOVONOX) 0.005 % cream Apply topically 2 (two) times daily. 11/15/16   Cuthriell, Delorise Royals, PA-C  ?dicyclomine (BENTYL) 10 MG capsule Take 1 capsule (10 mg total) by mouth 4 (four) times daily -   before meals and at bedtime. 05/30/21   Jene Every, MD  ?esomeprazole (NEXIUM) 40 MG capsule Take 1 capsule (40 mg total) by mouth daily. 10/25/19 10/24/20  Charlynne Pander, MD  ?magic mouthwash w/lidocaine SOLN Take 5 mLs by mouth 4 (four) times daily. 06/02/19   Cuthriell, Delorise Royals, PA-C  ?omeprazole (PRILOSEC) 40 MG capsule Take 1 capsule (40 mg total) by mouth daily. 10/16/17   Sherrie Mustache Roselyn Bering, PA-C  ?predniSONE (DELTASONE) 50 MG tablet Take 1 tablet (50 mg total) by mouth daily with breakfast. 08/24/21   Cuthriell, Delorise Royals, PA-C  ?sucralfate (CARAFATE) 1 g tablet Take 1 tablet (1 g total) by mouth 2 (two) times daily. 10/25/19 10/24/20  Charlynne Pander, MD  ? ? ?Physical Exam  ? ?Triage Vital Signs: ?ED Triage Vitals [11/01/21 2209]  ?Enc Vitals Group  ?   BP (!) 146/75  ?   Pulse Rate 89  ?   Resp 20  ?   Temp (!) 97.3 ?F (36.3 ?C)  ?   Temp Source Oral  ?   SpO2 98 %  ?   Weight 240 lb (108.9 kg)  ?   Height 6\' 2"  (1.88 m)  ?   Head Circumference   ?   Peak Flow   ?   Pain Score 7  ?   Pain Loc   ?   Pain Edu?   ?   Excl. in GC?   ? ? ?Most recent vital signs: ?Vitals:  ? 11/01/21 2209 11/01/21 2330  ?BP: 2331)  146/75   ?Pulse: 89 83  ?Resp: 20 (!) 22  ?Temp: (!) 97.3 ?F (36.3 ?C)   ?SpO2: 98% 97%  ? ? ?CONSTITUTIONAL: Alert and oriented and responds appropriately to questions. Well-appearing; well-nourished ?HEAD: Normocephalic, atraumatic ?EYES: Conjunctivae clear, pupils appear equal, sclera nonicteric ?ENT: normal nose; moist mucous membranes; No pharyngeal erythema or petechiae, no tonsillar hypertrophy or exudate, no uvular deviation, no unilateral swelling in posterior oropharynx, no trismus or drooling, no muffled voice, normal phonation, no stridor, airway patent. ?NECK: Supple, normal ROM ?CARD: RRR; S1 and S2 appreciated; no murmurs, no clicks, no rubs, no gallops ?RESP: Normal chest excursion without splinting or tachypnea; breath sounds clear and equal bilaterally; no wheezes, no rhonchi,  no rales, no hypoxia or respiratory distress, speaking full sentences ?ABD/GI: Normal bowel sounds; non-distended; soft, non-tender, no rebound, no guarding, no peritoneal signs ?BACK: The back appears normal ?EXT: Normal ROM in all joints; no deformity noted, no edema; no cyanosis, no calf tenderness or calf swelling ?SKIN: Normal color for age and race; warm; no rash on exposed skin ?NEURO: Moves all extremities equally, normal speech ?PSYCH: The patient's mood and manner are appropriate. ? ? ?ED Results / Procedures / Treatments  ? ?LABS: ?(all labs ordered are listed, but only abnormal results are displayed) ?Labs Reviewed  ?COMPREHENSIVE METABOLIC PANEL - Abnormal; Notable for the following components:  ?    Result Value  ? Glucose, Bld 105 (*)   ? AST 46 (*)   ? ALT 48 (*)   ? All other components within normal limits  ?RESP PANEL BY RT-PCR (FLU A&B, COVID) ARPGX2  ?CBC  ?TROPONIN I (HIGH SENSITIVITY)  ? ? ? ?EKG: ? EKG Interpretation ? ?Date/Time:  Tuesday November 01 2021 22:11:52 EDT ?Ventricular Rate:  88 ?PR Interval:  202 ?QRS Duration: 82 ?QT Interval:  360 ?QTC Calculation: 435 ?R Axis:   33 ?Text Interpretation: Normal sinus rhythm Possible Left atrial enlargement Borderline ECG When compared with ECG of 25-Oct-2019 20:47, No significant change was found Confirmed by Rochele RaringWard, Kohner Orlick [phone]([id number]) on 11/01/2021 11:44:20 PM ?  ? ?  ? ? ? ?RADIOLOGY: ?My personal review and interpretation of imaging: Chest x-ray clear. ? ?I have personally reviewed all radiology reports.   ?DG Chest 2 View ? ?Result Date: 11/01/2021 ?CLINICAL DATA:  Chest pain, short of breath, fever EXAM: CHEST - 2 VIEW COMPARISON:  08/24/2021 FINDINGS: Frontal and lateral views of the chest demonstrate a stable cardiac silhouette. No acute airspace disease, effusion, or pneumothorax. No acute bony abnormalities. IMPRESSION: 1. Stable chest, no acute process. Electronically Signed   By: Sharlet SalinaMichael  Brown M.D.   On: 11/01/2021 22:32    ? ? ?PROCEDURES: ? ?Critical Care performed: No ? ? ?CRITICAL CARE ?Performed by: Baxter HireKristen Delmo Matty ? ? ?Total critical care time: 0 minutes ? ?Critical care time was exclusive of separately billable procedures and treating other patients. ? ?Critical care was necessary to treat or prevent imminent or life-threatening deterioration. ? ?Critical care was time spent personally by me on the following activities: development of treatment plan with patient and/or surrogate as well as nursing, discussions with consultants, evaluation of patient's response to treatment, examination of patient, obtaining history from patient or surrogate, ordering and performing treatments and interventions, ordering and review of laboratory studies, ordering and review of radiographic studies, pulse oximetry and re-evaluation of patient's condition. ? ? ?.1-3 Lead EKG Interpretation ?Performed by: Kaileb Monsanto, Layla MawKristen N, DO ?Authorized by: Agata Lucente, Layla MawKristen N, DO  ? ?  Interpretation:  normal   ?  ECG rate:  85 ?  ECG rate assessment: normal   ?  Rhythm: sinus rhythm   ?  Ectopy: none   ?  Conduction: normal   ? ? ? ?IMPRESSION / MDM / ASSESSMENT AND PLAN / ED COURSE  ?I reviewed the triage vital signs and the nursing notes. ? ? ? ?Patient here with complaints of shortness of breath, wheezing and feeling like he has a recurrent episode of bronchitis.  Was seen in the emergency department in December and January for the same. ? ?The patient is on the cardiac monitor to evaluate for evidence of arrhythmia and/or significant heart rate changes. ? ? ?DIFFERENTIAL DIAGNOSIS (includes but not limited to):   Viral URI, pneumonia, less likely ACS, PE, dissection, pneumothorax, CHF ? ? ?PLAN: We will obtain CBC, BMP, troponin x1, EKG, chest x-ray.  Have offered him a breathing treatment for symptomatic relief which he declines.  States he would just like an albuterol inhaler to take home.  We will also give him prednisone, ibuprofen. ? ? ?MEDICATIONS GIVEN IN  ED: ?Medications  ?predniSONE (DELTASONE) tablet 60 mg (has no administration in time range)  ?ibuprofen (ADVIL) tablet 800 mg (has no administration in time range)  ?albuterol (VENTOLIN HFA) 108 (90 Base) MCG/ACT inhaler

## 2021-11-02 LAB — TROPONIN I (HIGH SENSITIVITY): Troponin I (High Sensitivity): 9 ng/L (ref <18)

## 2021-11-02 NOTE — ED Notes (Signed)
Pt discharge information reviewed. Pt understands need for follow up care and when to return if symptoms worsen. All questions answered. Pt is alert and oriented with even and regular respirations. Pt is seen ambulating out of department with string steady gait.  Pt refuses last set up discharge vitals. ?

## 2022-01-25 ENCOUNTER — Emergency Department
Admission: EM | Admit: 2022-01-25 | Discharge: 2022-01-25 | Disposition: A | Discharge status: Home / Self Care | Payer: Self-pay | Attending: Emergency Medicine | Admitting: Emergency Medicine

## 2022-01-25 ENCOUNTER — Encounter: Payer: Self-pay | Admitting: Emergency Medicine

## 2022-01-25 ENCOUNTER — Other Ambulatory Visit: Payer: Self-pay

## 2022-01-25 DIAGNOSIS — J020 Streptococcal pharyngitis: Secondary | ICD-10-CM | POA: Insufficient documentation

## 2022-01-25 DIAGNOSIS — J029 Acute pharyngitis, unspecified: Secondary | ICD-10-CM

## 2022-01-25 LAB — GROUP A STREP BY PCR: Group A Strep by PCR: DETECTED — AB

## 2022-01-25 MED ORDER — AMOXICILLIN 500 MG PO CAPS
500.0000 mg | ORAL_CAPSULE | Freq: Once | ORAL | Status: AC
  Administered 2022-01-25: 500 mg via ORAL
  Filled 2022-01-25: qty 1

## 2022-01-25 MED ORDER — DEXAMETHASONE 10 MG/ML FOR PEDIATRIC ORAL USE
10.0000 mg | Freq: Once | INTRAMUSCULAR | Status: AC
  Administered 2022-01-25: 10 mg via ORAL
  Filled 2022-01-25: qty 1

## 2022-01-25 MED ORDER — AMOXICILLIN 875 MG PO TABS: 875.0000 mg | ORAL_TABLET | Freq: Two times a day (BID) | ORAL | 0 refills | Status: AC

## 2022-01-25 NOTE — ED Triage Notes (Signed)
Patient ambulatory to triage with steady gait, without difficulty or distress noted; pt reports sore throat x 2 days with no accomp symptoms

## 2022-01-25 NOTE — ED Notes (Signed)
Patient given discharge instructions, all questions answered. Patient in possession of all belongings, directed to the discharge area  

## 2022-01-25 NOTE — ED Notes (Signed)
Pt reports his girlfriend was dx with strep throat. Pt now has a sore throat.

## 2022-01-25 NOTE — ED Provider Notes (Signed)
[facility] Provider Note  Patient Contact: 10:00 PM (approximate)   History   Sore Throat   HPI  Stanley Cobb is a 41 y.o. male presents to the emergency department with pharyngitis for the past 2 days.  Patient's significant other recently had strep throat.  He has been able to manage his own secretions and speaking in complete sentence.  No swelling at the neck.      Physical Exam   Triage Vital Signs: ED Triage Vitals  Enc Vitals Group     BP 01/25/22 2033 (!) 157/106     Pulse Rate 01/25/22 2033 96     Resp 01/25/22 2033 16     Temp 01/25/22 2033 98.4 F (36.9 C)     Temp Source 01/25/22 2033 Oral     SpO2 01/25/22 2033 97 %     Weight 01/25/22 2031 240 lb (108.9 kg)     Height 01/25/22 2031 6\' 2"  (1.88 m)     Head Circumference --      Peak Flow --      Pain Score 01/25/22 2031 8     Pain Loc --      Pain Edu? --      Excl. in GC? --     Most recent vital signs: Vitals:   01/25/22 2033  BP: (!) 157/106  Pulse: 96  Resp: 16  Temp: 98.4 F (36.9 C)  SpO2: 97%     General: Alert and in no acute distress. Eyes:  PERRL. EOMI. Head: No acute traumatic findings ENT:      Nose: No congestion/rhinnorhea.      Mouth/Throat: Mucous membranes are moist.  Patient has erythema of the posterior pharynx with tonsillar exudate. Neck: No stridor. No cervical spine tenderness to palpation. Hematological/Lymphatic/Immunilogical: Palpable cervical lymphadenopathy. Cardiovascular:  Good peripheral perfusion Respiratory: Normal respiratory effort without tachypnea or retractions. Lungs CTAB. Good air entry to the bases with no decreased or absent breath sounds. Gastrointestinal: Bowel sounds 4 quadrants. Soft and nontender to palpation. No guarding or rigidity. No palpable masses. No distention. No CVA tenderness. Musculoskeletal: Full range of motion to all extremities.  Neurologic:  No gross focal neurologic deficits are appreciated.   Skin:   No rash noted    ED Results / Procedures / Treatments   Labs (all labs ordered are listed, but only abnormal results are displayed) Labs Reviewed  GROUP A STREP BY PCR - Abnormal; Notable for the following components:      Result Value   Group A Strep by PCR DETECTED (*)    All other components within normal limits        PROCEDURES:  Critical Care performed: No  Procedures   MEDICATIONS ORDERED IN ED: Medications  dexamethasone (DECADRON) 10 MG/ML injection for Pediatric ORAL use 10 mg (has no administration in time range)  amoxicillin (AMOXIL) capsule 500 mg (500 mg Oral Given 01/25/22 2156)     IMPRESSION / MDM / ASSESSMENT AND PLAN / ED COURSE  I reviewed the triage vital signs and the nursing notes.                              Assessment and plan Strep throat 41 year old male presents to the emergency department with pharyngitis for the past 2 days.  Patient was hypertensive at triage but vital signs otherwise reassuring.  He was alert, active and nontoxic-appearing.  He tested positive for  group A strep.  Patient was given a dose of oral Decadron in the emergency department and his first dose of amoxicillin.  He was discharged with amoxicillin twice daily for the next 10 days.      FINAL CLINICAL IMPRESSION(S) / ED DIAGNOSES   Final diagnoses:  Sore throat     Rx / DC Orders   ED Discharge Orders     None        Note:  This document was prepared using Dragon voice recognition software and may include unintentional dictation errors.   Pia Mau [place], Cordelia Poche 01/25/22 2202    Chesley Noon, MD 01/26/22 [phone]

## 2022-01-25 NOTE — Discharge Instructions (Signed)
Take amoxicillin twice daily for the next 10 days. 

## 2022-02-22 ENCOUNTER — Encounter: Payer: Self-pay | Admitting: Emergency Medicine

## 2022-02-22 ENCOUNTER — Other Ambulatory Visit: Payer: Self-pay

## 2022-02-22 DIAGNOSIS — J020 Streptococcal pharyngitis: Secondary | ICD-10-CM | POA: Insufficient documentation

## 2022-02-22 LAB — GROUP A STREP BY PCR: Group A Strep by PCR: DETECTED — AB

## 2022-02-22 NOTE — ED Triage Notes (Signed)
Patient ambulatory to triage with steady gait, without difficulty or distress noted; pt reports sore throat since yesterday 

## 2022-02-23 ENCOUNTER — Emergency Department
Admission: EM | Admit: 2022-02-23 | Discharge: 2022-02-23 | Disposition: A | Discharge status: Home / Self Care | Payer: Self-pay | Attending: Emergency Medicine | Admitting: Emergency Medicine

## 2022-02-23 DIAGNOSIS — J020 Streptococcal pharyngitis: Secondary | ICD-10-CM

## 2022-02-23 MED ORDER — DEXAMETHASONE 4 MG PO TABS
10.0000 mg | ORAL_TABLET | Freq: Once | ORAL | Status: AC
  Administered 2022-02-23: 10 mg via ORAL
  Filled 2022-02-23: qty 1

## 2022-02-23 MED ORDER — PENICILLIN G BENZATHINE 1200000 UNIT/2ML IM SUSY
1.2000 10*6.[IU] | PREFILLED_SYRINGE | Freq: Once | INTRAMUSCULAR | Status: AC
  Administered 2022-02-23: 1.2 10*6.[IU] via INTRAMUSCULAR
  Filled 2022-02-23: qty 2

## 2022-02-23 MED ORDER — IBUPROFEN 800 MG PO TABS
800.0000 mg | ORAL_TABLET | Freq: Once | ORAL | Status: AC
  Administered 2022-02-23: 800 mg via ORAL
  Filled 2022-02-23: qty 1

## 2022-02-23 NOTE — Discharge Instructions (Addendum)
You may alternate Tylenol 1000 mg every 6 hours as needed for pain, fever and Ibuprofen 800 mg every 6-8 hours as needed for pain, fever.  Please take Ibuprofen with food.  Do not take more than 4000 mg of Tylenol (acetaminophen) in a 24 hour period.  Steps to find a Primary Care Provider (PCP):  Call [phone] or [phone] to access "[facility] Find a Doctor Service."  2.  You may also go on the [facility] [place] [email or web address] website at [email or web address]  

## 2022-02-23 NOTE — ED Notes (Signed)
Pt had no s/s of an adverse reaction to the ABX and has been dc'd.

## 2022-02-23 NOTE — ED Provider Notes (Signed)
[facility] Provider Note    Event Date/Time   First MD Initiated Contact with Patient 02/23/22 0123     (approximate)   History   Sore Throat   HPI  Stanley Cobb is a 41 y.o. male with history of psoriasis who presents to the emergency department with complaints of sore throat for several days.  No fevers, cough, vomiting or diarrhea.  Had strep pharyngitis about a month ago that he reports he took oral antibiotics for.   History provided by patient.    Past Medical History:  Diagnosis Date   Gastric ulcer    Psoriasis     History reviewed. No pertinent surgical history.  MEDICATIONS:  Prior to Admission medications   Medication Sig Start Date End Date Taking? Authorizing Provider  albuterol (VENTOLIN HFA) 108 (90 Base) MCG/ACT inhaler Inhale 2 puffs into the lungs every 6 (six) hours as needed for wheezing or shortness of breath. 03/30/21   Menshew, Charlesetta Ivory, PA-C  albuterol (VENTOLIN HFA) 108 (90 Base) MCG/ACT inhaler Inhale 2 puffs into the lungs every 4 (four) hours as needed for wheezing or shortness of breath. 11/01/21   Abir Eroh, Layla Maw, DO  esomeprazole (NEXIUM) 40 MG capsule Take 1 capsule (40 mg total) by mouth daily. 10/25/19 10/24/20  Charlynne Pander, MD  omeprazole (PRILOSEC) 40 MG capsule Take 1 capsule (40 mg total) by mouth daily. 10/16/17   Fisher, Roselyn Bering, PA-C  predniSONE (STERAPRED UNI-PAK 21 TAB) 10 MG (21) TBPK tablet Take as directed 11/01/21   Roark Rufo, Layla Maw, DO  sucralfate (CARAFATE) 1 g tablet Take 1 tablet (1 g total) by mouth 2 (two) times daily. 10/25/19 10/24/20  Charlynne Pander, MD    Physical Exam   Triage Vital Signs: ED Triage Vitals  Enc Vitals Group     BP 02/22/22 2244 128/88     Pulse Rate 02/22/22 2244 80     Resp 02/22/22 2244 20     Temp 02/22/22 2244 98.4 F (36.9 C)     Temp Source 02/22/22 2244 Oral     SpO2 02/22/22 2244 100 %     Weight 02/22/22 2216 240 lb 1.3 oz (108.9 kg)      Height 02/22/22 2216 6\' 2"  (1.88 m)     Head Circumference --      Peak Flow --      Pain Score 02/22/22 2216 8     Pain Loc --      Pain Edu? --      Excl. in GC? --     Most recent vital signs: Vitals:   02/22/22 2244 02/23/22 0238  BP: 128/88   Pulse: 80 75  Resp: 20 18  Temp: 98.4 F (36.9 C)   SpO2: 100% 99%    CONSTITUTIONAL: Alert and oriented and responds appropriately to questions. Well-appearing; well-nourished HEAD: Normocephalic, atraumatic EYES: Conjunctivae clear, pupils appear equal, sclera nonicteric ENT: normal nose; moist mucous membranes, patient has pharyngeal erythema and mild tonsillar hypertrophy with exudate.  No uvular deviation, no trismus or drooling.  Normal phonation.  Handling secretions without difficulty.  No angioedema.  Airway patent. NECK: Supple, normal ROM CARD: RRR; S1 and S2 appreciated; no murmurs, no clicks, no rubs, no gallops RESP: Normal chest excursion without splinting or tachypnea; breath sounds clear and equal bilaterally; no wheezes, no rhonchi, no rales, no hypoxia or respiratory distress, speaking full sentences ABD/GI: Normal bowel sounds; non-distended; soft, non-tender, no rebound, no guarding,  no peritoneal signs BACK: The back appears normal EXT: Normal ROM in all joints; no deformity noted, no edema; no cyanosis SKIN: Normal color for age and race; warm; no rash on exposed skin NEURO: Moves all extremities equally, normal speech PSYCH: The patient's mood and manner are appropriate.   ED Results / Procedures / Treatments   LABS: (all labs ordered are listed, but only abnormal results are displayed) Labs Reviewed  GROUP A STREP BY PCR - Abnormal; Notable for the following components:      Result Value   Group A Strep by PCR DETECTED (*)    All other components within normal limits     EKG:   RADIOLOGY: My personal review and interpretation of imaging:    I have personally reviewed all radiology reports.   No  results found.   PROCEDURES:  Critical Care performed:      Procedures    IMPRESSION / MDM / ASSESSMENT AND PLAN / ED COURSE  I reviewed the triage vital signs and the nursing notes.    Patient here with sore throat.     DIFFERENTIAL DIAGNOSIS (includes but not limited to):   Strep pharyngitis, viral pharyngitis, mononucleosis, doubt PTA, deep space neck infection   Patient's presentation is most consistent with acute, uncomplicated illness.   PLAN: Strep swab obtained from triage is positive.  Will give IM penicillin, Decadron and ibuprofen for symptomatic relief.  His airway is patent and he is tolerating secretions.  No uvular deviation.  Normal phonation.  Discussed supportive care instructions and return precautions.  Given this is his second round of strep pharyngitis in the past couple of months, given ENT follow-up.   MEDICATIONS GIVEN IN ED: Medications  penicillin g benzathine (BICILLIN LA) 1200000 UNIT/2ML injection 1.2 Million Units (1.2 Million Units Intramuscular Given 02/23/22 0222)  dexamethasone (DECADRON) tablet 10 mg (10 mg Oral Given 02/23/22 0221)  ibuprofen (ADVIL) tablet 800 mg (800 mg Oral Given 02/23/22 0221)     ED COURSE:  At this time, I do not feel there is any life-threatening condition present. I reviewed all nursing notes, vitals, pertinent previous records.  All lab and urine results, EKGs, imaging ordered have been independently reviewed and interpreted by myself.  I reviewed all available radiology reports from any imaging ordered this visit.  Based on my assessment, I feel the patient is safe to be discharged home without further emergent workup and can continue workup as an outpatient as needed. Discussed all findings, treatment plan as well as usual and customary return precautions.  They verbalize understanding and are comfortable with this plan.  Outpatient follow-up has been provided as needed.  All questions have been  answered.    CONSULTS: No ENT consult needed at this time for uncomplicated strep pharyngitis without signs of deep space neck infection, PTA, airway obstruction, sepsis.   OUTSIDE RECORDS REVIEWED: Reviewed patient's last dermatology note with Lolita Cram at [facility] on 05/18/2021.       FINAL CLINICAL IMPRESSION(S) / ED DIAGNOSES   Final diagnoses:  Strep pharyngitis     Rx / DC Orders   ED Discharge Orders     None        Note:  This document was prepared using Dragon voice recognition software and may include unintentional dictation errors.   Carlyle Mcelrath, Layla Maw, DO 02/23/22 [phone]

## 2022-08-18 ENCOUNTER — Other Ambulatory Visit: Payer: Self-pay

## 2022-08-18 ENCOUNTER — Emergency Department
Admission: EM | Admit: 2022-08-18 | Discharge: 2022-08-18 | Disposition: A | Discharge status: Home / Self Care | Payer: Self-pay | Attending: Emergency Medicine | Admitting: Emergency Medicine

## 2022-08-18 DIAGNOSIS — L309 Dermatitis, unspecified: Secondary | ICD-10-CM

## 2022-08-18 DIAGNOSIS — L710 Perioral dermatitis: Secondary | ICD-10-CM | POA: Insufficient documentation

## 2022-08-18 MED ORDER — OLOPATADINE HCL 0.2 % OP SOLN: 1.0000 [drp] | Freq: Every day | OPHTHALMIC | 0 refills | Status: AC

## 2022-08-18 MED ORDER — HYDROCORTISONE 1 % EX OINT: 1.0000 | TOPICAL_OINTMENT | Freq: Two times a day (BID) | CUTANEOUS | 0 refills | Status: AC

## 2022-08-18 NOTE — ED Notes (Signed)
Pt verbalizes understanding of discharge instructions. Opportunity for questioning and answers were provided. Pt discharged from ED to home.   ? ?

## 2022-08-18 NOTE — ED Triage Notes (Signed)
Pt presents to ED with c/o of bilateral under eye swelling. Pt states this has been ongoing for 3-4 week.s Pt denies getting irritants in it. NAD noted.

## 2022-08-18 NOTE — ED Provider Notes (Signed)
[facility] Emergency Department Provider Note     Event Date/Time   First MD Initiated Contact with Patient 08/18/22 1618     (approximate)   History   Eye Problem   HPI  Stanley Cobb is a 42 y.o. male with a history of psoriasis and gastric ulcers, presents to the ED for evaluation of bilateral skin irritation and swelling under the eyes. He notes 3-4 weeks of symptoms.  He notes was evaluated at another ED and provided with an antibiotic eye ointment as well as some oral steroids.  He denies any significant benefit.  Denies FCS, eye drainage, crusting, matting, or redness.    Physical Exam   Triage Vital Signs: ED Triage Vitals  Enc Vitals Group     BP 08/18/22 1538 (!) 145/117     Pulse Rate 08/18/22 1537 72     Resp 08/18/22 1537 17     Temp 08/18/22 1537 97.8 F (36.6 C)     Temp Source 08/18/22 1537 Oral     SpO2 08/18/22 1537 97 %     Weight --      Height --      Head Circumference --      Peak Flow --      Pain Score 08/18/22 1537 0     Pain Loc --      Pain Edu? --      Excl. in [place]? --     Most recent vital signs: Vitals:   08/18/22 1538 08/18/22 1648  BP: (!) 145/117 (!) 137/94  Pulse:  70  Resp:  18  Temp:    SpO2:  98%    General Awake, no distress. NAD HEENT NCAT. PERRL. EOMI. no conjunctival irritation or injection appreciated.  Periorbital skin does appear mildly erythematous with some scaly skin noted.  Patient with bilateral inferior periorbital swelling noted.  No rhinorrhea. Mucous membranes are moist.  CV:  Good peripheral perfusion.  RESP:  Normal effort.  ABD:  No distention.    ED Results / Procedures / Treatments   Labs (all labs ordered are listed, but only abnormal results are displayed) Labs Reviewed - No data to display   EKG    RADIOLOGY No results found.   PROCEDURES:  Critical Care performed: No  Procedures   MEDICATIONS ORDERED IN ED: Medications - No data to  display   IMPRESSION / MDM / [place] / ED COURSE  I reviewed the triage vital signs and the nursing notes.                              Differential diagnosis includes, but is not limited to, allergic conjunctivitis, contact dermatitis, eczema exacerbation  Patient's presentation is most consistent with acute, uncomplicated illness.  Patient's diagnosis is consistent with periorbital edema and periorbital skin irritation. Patient will be discharged home with prescriptions for hydrocortisone cream and Pataday eyedrops. Patient is to follow up with [facility] as needed or otherwise directed. Patient is given ED precautions to return to the ED for any worsening or new symptoms.     FINAL CLINICAL IMPRESSION(S) / ED DIAGNOSES   Final diagnoses:  Periorbital dermatitis     Rx / DC Orders   ED Discharge Orders          Ordered    hydrocortisone 1 % ointment  2 times daily  08/18/22 1723    Olopatadine HCl 0.2 % SOLN  Daily        08/18/22 1723             Note:  This document was prepared using Dragon voice recognition software and may include unintentional dictation errors.    Melvenia Needles, PA-C 08/18/22 1916    Harvest Dark, MD 08/18/22 1928

## 2022-08-18 NOTE — Discharge Instructions (Addendum)
Use the eye ointment and eyedrops as directed. Follow-up with [facility] and select a primary care provider in [place] for ongoing medical care.

## 2023-10-23 ENCOUNTER — Other Ambulatory Visit: Payer: Self-pay | Admitting: Nurse Practitioner

## 2023-10-23 DIAGNOSIS — R220 Localized swelling, mass and lump, head: Secondary | ICD-10-CM

## 2023-10-25 ENCOUNTER — Encounter: Payer: Self-pay | Admitting: Nurse Practitioner

## 2023-10-31 ENCOUNTER — Ambulatory Visit
Admission: RE | Admit: 2023-10-31 | Discharge: 2023-10-31 | Disposition: A | Discharge status: Home / Self Care | Source: Ambulatory Visit | Attending: Nurse Practitioner | Admitting: Nurse Practitioner

## 2023-10-31 DIAGNOSIS — R220 Localized swelling, mass and lump, head: Secondary | ICD-10-CM
